# Patient Record
Sex: Male | Born: 1970 | Race: Black or African American | Hispanic: No | Smoking: Current some day smoker
Health system: Southern US, Community
[De-identification: ages and names within clinical notes are randomized; demographics above are authoritative.]

## PROBLEM LIST (undated history)

## (undated) DIAGNOSIS — I10 Essential (primary) hypertension: Secondary | ICD-10-CM

## (undated) DIAGNOSIS — E785 Hyperlipidemia, unspecified: Secondary | ICD-10-CM

## (undated) DIAGNOSIS — S82899A Other fracture of unspecified lower leg, initial encounter for closed fracture: Secondary | ICD-10-CM

## (undated) DIAGNOSIS — E119 Type 2 diabetes mellitus without complications: Secondary | ICD-10-CM

## (undated) DIAGNOSIS — N189 Chronic kidney disease, unspecified: Secondary | ICD-10-CM

## (undated) HISTORY — DX: Essential (primary) hypertension: I10

## (undated) HISTORY — DX: Hyperlipidemia, unspecified: E78.5

## (undated) HISTORY — PX: NEPHRECTOMY: SHX65

## (undated) HISTORY — DX: Chronic kidney disease, unspecified: N18.9

## (undated) HISTORY — DX: Type 2 diabetes mellitus without complications: E11.9

---

## 2001-10-07 ENCOUNTER — Encounter: Payer: Self-pay | Admitting: Family Medicine

## 2001-10-07 ENCOUNTER — Ambulatory Visit (HOSPITAL_COMMUNITY): Admission: RE | Admit: 2001-10-07 | Discharge: 2001-10-07 | Payer: Self-pay | Admitting: Family Medicine

## 2015-07-06 LAB — HEMOGLOBIN A1C: Hgb A1c MFr Bld: 9.3 % — AB (ref 4.0–6.0)

## 2015-08-14 ENCOUNTER — Ambulatory Visit (INDEPENDENT_AMBULATORY_CARE_PROVIDER_SITE_OTHER): Payer: BLUE CROSS/BLUE SHIELD | Admitting: "Endocrinology

## 2015-08-14 ENCOUNTER — Encounter: Payer: Self-pay | Admitting: "Endocrinology

## 2015-08-14 VITALS — BP 132/92 | HR 82 | Ht 71.0 in | Wt 223.0 lb

## 2015-08-14 DIAGNOSIS — E785 Hyperlipidemia, unspecified: Secondary | ICD-10-CM | POA: Diagnosis not present

## 2015-08-14 DIAGNOSIS — E1122 Type 2 diabetes mellitus with diabetic chronic kidney disease: Secondary | ICD-10-CM | POA: Insufficient documentation

## 2015-08-14 DIAGNOSIS — I10 Essential (primary) hypertension: Secondary | ICD-10-CM | POA: Diagnosis not present

## 2015-08-14 MED ORDER — SITAGLIPTIN PHOSPHATE 25 MG PO TABS: 25.0000 mg | ORAL_TABLET | Freq: Every day | ORAL | Status: DC

## 2015-08-14 NOTE — Patient Instructions (Signed)

## 2015-08-14 NOTE — Progress Notes (Signed)
Subjective:    Patient ID: Bryan Glenn, male    DOB: 05/29/1971. Patient is being seen in consultation for management of diabetes requested by  No primary care provider on file.  History reviewed. No pertinent past medical history. Past Surgical History  Procedure Laterality Date  . Nephrectomy     Social History   Social History  . Marital Status: Married    Spouse Name: N/A  . Number of Children: N/A  . Years of Education: N/A   Social History Main Topics  . Smoking status: Current Some Day Smoker  . Smokeless tobacco: None  . Alcohol Use: 0.0 oz/week    0 Standard drinks or equivalent per week  . Drug Use: No  . Sexual Activity: Not Asked   Other Topics Concern  . None   Social History Narrative  . None   Outpatient Encounter Prescriptions as of 08/14/2015  Medication Sig  . amLODipine (NORVASC) 10 MG tablet Take 10 mg by mouth daily.  Marland Kitchen losartan (COZAAR) 50 MG tablet Take 50 mg by mouth daily.  . multivitamin (RENA-VIT) TABS tablet Take 1 tablet by mouth daily.  . pravastatin (PRAVACHOL) 40 MG tablet Take 40 mg by mouth daily.  . [DISCONTINUED] glyBURIDE (DIABETA) 5 MG tablet Take 5 mg by mouth daily with breakfast.  . [DISCONTINUED] sitaGLIPtin (JANUVIA) 100 MG tablet Take 100 mg by mouth daily.  . sitaGLIPtin (JANUVIA) 25 MG tablet Take 1 tablet (25 mg total) by mouth daily.   No facility-administered encounter medications on file as of 08/14/2015.   ALLERGIES: No Known Allergies VACCINATION STATUS:  There is no immunization history on file for this patient.  Diabetes He presents for his initial diabetic visit. He has type 2 diabetes mellitus. Onset time: He was diagnosed at approximate age of 45 years. His disease course has been worsening. There are no hypoglycemic associated symptoms. Pertinent negatives for hypoglycemia include no confusion, headaches, pallor or seizures. Associated symptoms include polydipsia and polyuria. Pertinent negatives  for diabetes include no chest pain, no fatigue, no polyphagia and no weakness. There are no hypoglycemic complications. Symptoms are worsening. Diabetic complications include nephropathy. Risk factors for coronary artery disease include dyslipidemia, diabetes mellitus, hypertension, sedentary lifestyle and tobacco exposure. Current diabetic treatment includes oral agent (dual therapy). He is compliant with treatment most of the time. His weight is increasing steadily. He is following a generally unhealthy diet. He has not had a previous visit with a dietitian. He rarely participates in exercise. Home blood sugar record trend: He did not bring any meter nor log to review, and admits he does not monitor regularly. An ACE inhibitor/angiotensin II receptor blocker is being taken.  Hyperlipidemia This is a chronic problem. The current episode started more than 1 year ago. Pertinent negatives include no chest pain, myalgias or shortness of breath. Current antihyperlipidemic treatment includes statins. Risk factors for coronary artery disease include a sedentary lifestyle, male sex, diabetes mellitus and hypertension.  Hypertension This is a chronic problem. The current episode started more than 1 year ago. The problem is uncontrolled. Pertinent negatives include no chest pain, headaches, neck pain, palpitations or shortness of breath. Past treatments include ACE inhibitors. Hypertensive end-organ damage includes kidney disease.       Review of Systems  Constitutional: Negative for fatigue and unexpected weight change.  HENT: Negative for dental problem, mouth sores and trouble swallowing.   Eyes: Negative for visual disturbance.  Respiratory: Negative for cough, choking, chest tightness, shortness of  breath and wheezing.   Cardiovascular: Negative for chest pain, palpitations and leg swelling.  Gastrointestinal: Negative for nausea, vomiting, abdominal pain, diarrhea, constipation and abdominal distention.   Endocrine: Positive for polydipsia and polyuria. Negative for polyphagia.  Genitourinary: Negative for dysuria, urgency, hematuria and flank pain.  Musculoskeletal: Negative for myalgias, back pain, gait problem and neck pain.  Skin: Negative for pallor, rash and wound.  Neurological: Negative for seizures, syncope, weakness, numbness and headaches.  Psychiatric/Behavioral: Negative.  Negative for confusion and dysphoric mood.    Objective:    BP 132/92 mmHg  Pulse 82  Ht 5' 11"  (1.803 m)  Wt 223 lb (101.152 kg)  BMI 31.12 kg/m2  SpO2 99%  Wt Readings from Last 3 Encounters:  08/14/15 223 lb (101.152 kg)    Physical Exam  Constitutional: He is oriented to person, place, and time. He appears well-developed and well-nourished. He is cooperative. No distress.  HENT:  Head: Normocephalic and atraumatic.  Eyes: EOM are normal.  Neck: Normal range of motion. Neck supple. No tracheal deviation present. No thyromegaly present.  Cardiovascular: Normal rate, S1 normal, S2 normal and normal heart sounds.  Exam reveals no gallop.   No murmur heard. Pulses:      Dorsalis pedis pulses are 1+ on the right side, and 1+ on the left side.       Posterior tibial pulses are 1+ on the right side, and 1+ on the left side.  Pulmonary/Chest: Breath sounds normal. No respiratory distress. He has no wheezes.  Abdominal: Soft. Bowel sounds are normal. He exhibits no distension. There is no tenderness. There is no guarding and no CVA tenderness.  Musculoskeletal: He exhibits no edema.       Right shoulder: He exhibits no swelling and no deformity.  Neurological: He is alert and oriented to person, place, and time. He has normal strength and normal reflexes. No cranial nerve deficit or sensory deficit. Gait normal.  Skin: Skin is warm and dry. No rash noted. No cyanosis. Nails show no clubbing.  Psychiatric: He has a normal mood and affect. His speech is normal and behavior is normal. Judgment and thought  content normal. Cognition and memory are normal.    On 07/06/2015 his TSH was 9.3%  Assessment & Plan:   1. 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   - Patient has currently uncontrolled symptomatic type 2 DM since  44 years of age,  with most recent A1c of 9.3 %.   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 These are all discussed in detail with the patient.  - I have counseled the patient on diet management and weight loss, by adopting a carbohydrate restricted/protein rich diet.  - Suggestion is made for patient to avoid simple carbohydrates   from their diet including Cakes , Desserts, Ice Cream,  Soda (  diet and regular) , Sweet Tea , Candies,  Chips, Cookies, Artificial Sweeteners,   and "Sugar-free" Products . This will help patient to have stable blood glucose profile and potentially avoid unintended weight gain.  - I encouraged the patient to switch to  unprocessed or minimally  processed complex starch and increased protein intake (animal or plant source), fruits, and vegetables.  - Patient is advised to stick to a routine mealtimes to eat 3 meals  a day and avoid unnecessary snacks ( to snack only to correct hypoglycemia).  - The patient will be scheduled with Jearld Fenton, RDN, CDE for individualized DM education.  - I have approached patient with the following individualized plan to manage diabetes and patient agrees:   - He will likely need insulin therapy to control diabetes. -I approached him to start  strict monitoring of glucose  AC and HS and return in 10 days.  -Patient is encouraged to call clinic for  blood glucose levels less than 70 or above 300 mg /dl. - I will lower his dose of Januvia to 25 mg by mouth daily, discontinue glyburide, risk outweighs benefit for this patient.  -Patient is not a candidate for metformin andSGLT2 inhibitors due to CKD. -His best option is insulin treatment based on his commitment to monitor and blood glucose readings at next visit.  - Patient specific target  A1c;  LDL, HDL, Triglycerides, and  Waist Circumference were discussed in detail.  2) BP/HTN: Uncontrolled. Continue current medications including ACEI/ARB. 3) Lipids/HPL:   continue statins. 4)  Weight/Diet: CDE Consult will be initiated , exercise, and detailed carbohydrates information provided.  5) Chronic Care/Health Maintenance:  -Patient is on ACEI/ARB and Statin medications and encouraged to continue to follow up with Ophthalmology, Podiatrist at least yearly or according to recommendations, and advised to   stay away from smoking. I have recommended yearly flu vaccine and pneumonia vaccination at least every 5 years; moderate intensity exercise for up to 150 minutes weekly; and  sleep for at least 7 hours a day.  - 60 minutes of time was spent on the care of this patient , 50% of which was applied for counseling on diabetes complications and their preventions.  - Patient to bring meter and  blood glucose logs during their next visit.   - I advised patient to maintain close follow up with No primary care provider on file. for primary care needs.  Follow up plan: - Return in about 10 days (around 08/24/2015) for diabetes, high blood pressure, high cholesterol, follow up with meter and logs- no labs.  Glade Lloyd, MD Phone: [phone]  Fax: [phone]   08/14/2015, 1:51 PM

## 2015-08-30 ENCOUNTER — Ambulatory Visit: Payer: BLUE CROSS/BLUE SHIELD | Admitting: "Endocrinology

## 2015-09-01 ENCOUNTER — Ambulatory Visit: Payer: Self-pay | Admitting: Nutrition

## 2015-09-13 ENCOUNTER — Ambulatory Visit (INDEPENDENT_AMBULATORY_CARE_PROVIDER_SITE_OTHER): Payer: BLUE CROSS/BLUE SHIELD | Admitting: "Endocrinology

## 2015-09-13 ENCOUNTER — Encounter: Payer: Self-pay | Admitting: "Endocrinology

## 2015-09-13 VITALS — BP 117/82 | HR 75 | Ht 71.0 in | Wt 224.0 lb

## 2015-09-13 DIAGNOSIS — I10 Essential (primary) hypertension: Secondary | ICD-10-CM

## 2015-09-13 DIAGNOSIS — E1122 Type 2 diabetes mellitus with diabetic chronic kidney disease: Secondary | ICD-10-CM | POA: Diagnosis not present

## 2015-09-13 DIAGNOSIS — E785 Hyperlipidemia, unspecified: Secondary | ICD-10-CM | POA: Diagnosis not present

## 2015-09-13 MED ORDER — SITAGLIPTIN PHOSPHATE 25 MG PO TABS: 25.0000 mg | ORAL_TABLET | Freq: Every day | ORAL | Status: DC

## 2015-09-13 NOTE — Patient Instructions (Signed)

## 2015-09-13 NOTE — Progress Notes (Signed)
Subjective:    Patient ID: Bryan Glenn, male    DOB: 05-Oct-1970. Patient is being seen in consultation for management of diabetes requested by  No primary care provider on file.  History reviewed. No pertinent past medical history. Past Surgical History  Procedure Laterality Date  . Nephrectomy     Social History   Social History  . Marital Status: Married    Spouse Name: N/A  . Number of Children: N/A  . Years of Education: N/A   Social History Main Topics  . Smoking status: Current Some Day Smoker  . Smokeless tobacco: None  . Alcohol Use: 0.0 oz/week    0 Standard drinks or equivalent per week  . Drug Use: No  . Sexual Activity: Not Asked   Other Topics Concern  . None   Social History Narrative   Outpatient Encounter Prescriptions as of 09/13/2015  Medication Sig  . losartan (COZAAR) 50 MG tablet Take 50 mg by mouth daily.  . multivitamin (RENA-VIT) TABS tablet Take 1 tablet by mouth daily.  . pravastatin (PRAVACHOL) 40 MG tablet Take 40 mg by mouth daily.  . sitaGLIPtin (JANUVIA) 25 MG tablet Take 1 tablet (25 mg total) by mouth daily.  . [DISCONTINUED] sitaGLIPtin (JANUVIA) 25 MG tablet Take 1 tablet (25 mg total) by mouth daily.  Marland Kitchen amLODipine (NORVASC) 10 MG tablet Take 10 mg by mouth daily.   No facility-administered encounter medications on file as of 09/13/2015.   ALLERGIES: No Known Allergies VACCINATION STATUS:  There is no immunization history on file for this patient.  Diabetes He presents for his follow-up diabetic visit. He has type 2 diabetes mellitus. Onset time: He was diagnosed at approximate age of 45 years. His disease course has been worsening. There are no hypoglycemic associated symptoms. Pertinent negatives for hypoglycemia include no confusion, headaches, pallor or seizures. Associated symptoms include polydipsia and polyuria. Pertinent negatives for diabetes include no chest pain, no fatigue, no polyphagia and no weakness. There  are no hypoglycemic complications. Symptoms are worsening. Diabetic complications include nephropathy. Risk factors for coronary artery disease include dyslipidemia, diabetes mellitus, hypertension, sedentary lifestyle and tobacco exposure. Current diabetic treatment includes oral agent (dual therapy). He is compliant with treatment none of the time. His weight is stable. He is following a generally unhealthy diet. He has not had a previous visit with a dietitian. He rarely participates in exercise. Home blood sugar record trend: He was supposed to test blood glucose 4 times a day and bring his log along with his meter. Unfortunately he comes back without his meter and his logs show questionable readings of multiples of 10s. An ACE inhibitor/angiotensin II receptor blocker is being taken.  Hyperlipidemia This is a chronic problem. The current episode started more than 1 year ago. Pertinent negatives include no chest pain, myalgias or shortness of breath. Current antihyperlipidemic treatment includes statins. Risk factors for coronary artery disease include a sedentary lifestyle, male sex, diabetes mellitus and hypertension.  Hypertension This is a chronic problem. The current episode started more than 1 year ago. The problem is uncontrolled. Pertinent negatives include no chest pain, headaches, neck pain, palpitations or shortness of breath. Past treatments include ACE inhibitors. Hypertensive end-organ damage includes kidney disease.       Review of Systems  Constitutional: Negative for fatigue and unexpected weight change.  HENT: Negative for dental problem, mouth sores and trouble swallowing.   Eyes: Negative for visual disturbance.  Respiratory: Negative for cough, choking, chest tightness,  shortness of breath and wheezing.   Cardiovascular: Negative for chest pain, palpitations and leg swelling.  Gastrointestinal: Negative for nausea, vomiting, abdominal pain, diarrhea, constipation and  abdominal distention.  Endocrine: Positive for polydipsia and polyuria. Negative for polyphagia.  Genitourinary: Negative for dysuria, urgency, hematuria and flank pain.  Musculoskeletal: Negative for myalgias, back pain, gait problem and neck pain.  Skin: Negative for pallor, rash and wound.  Neurological: Negative for seizures, syncope, weakness, numbness and headaches.  Psychiatric/Behavioral: Negative.  Negative for confusion and dysphoric mood.    Objective:    BP 117/82 mmHg  Pulse 75  Ht 5' 11"  (1.803 m)  Wt 224 lb (101.606 kg)  BMI 31.26 kg/m2  SpO2 98%  Wt Readings from Last 3 Encounters:  09/13/15 224 lb (101.606 kg)  08/14/15 223 lb (101.152 kg)    Physical Exam  Constitutional: He is oriented to person, place, and time. He appears well-developed and well-nourished. He is cooperative. No distress.  HENT:  Head: Normocephalic and atraumatic.  Eyes: EOM are normal.  Neck: Normal range of motion. Neck supple. No tracheal deviation present. No thyromegaly present.  Cardiovascular: Normal rate, S1 normal, S2 normal and normal heart sounds.  Exam reveals no gallop.   No murmur heard. Pulses:      Dorsalis pedis pulses are 1+ on the right side, and 1+ on the left side.       Posterior tibial pulses are 1+ on the right side, and 1+ on the left side.  Pulmonary/Chest: Breath sounds normal. No respiratory distress. He has no wheezes.  Abdominal: Soft. Bowel sounds are normal. He exhibits no distension. There is no tenderness. There is no guarding and no CVA tenderness.  Musculoskeletal: He exhibits no edema.       Right shoulder: He exhibits no swelling and no deformity.  Neurological: He is alert and oriented to person, place, and time. He has normal strength and normal reflexes. No cranial nerve deficit or sensory deficit. Gait normal.  Skin: Skin is warm and dry. No rash noted. No cyanosis. Nails show no clubbing.  Psychiatric: He has a normal mood and affect. His speech is  normal and behavior is normal. Judgment and thought content normal. Cognition and memory are normal.    On 07/06/2015 his TSH was 9.3%  Assessment & Plan:   1. 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  -He did not commit to monitor blood glucose properly. - Patient has currently uncontrolled symptomatic type 2 DM since  45 years of age,  with most recent A1c of 9.3 %.   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 These are all discussed in detail with the patient.  - I have counseled the patient on diet management and weight loss, by adopting a carbohydrate restricted/protein rich diet.  - Suggestion is made for patient to avoid simple carbohydrates   from their diet including Cakes , Desserts, Ice Cream,  Soda (  diet and regular) , Sweet Tea , Candies,  Chips, Cookies, Artificial Sweeteners,   and "Sugar-free" Products . This will help patient to have stable blood glucose profile and potentially avoid  unintended weight gain.  - I encouraged the patient to switch to  unprocessed or minimally processed complex starch and increased protein intake (animal or plant source), fruits, and vegetables.  - Patient is advised to stick to a routine mealtimes to eat 3 meals  a day and avoid unnecessary snacks ( to snack only to correct hypoglycemia).  - The patient will be scheduled with Jearld Fenton, RDN, CDE for individualized DM education.  - I have approached patient with the following individualized plan to manage diabetes and patient agrees:   - He will likely need insulin therapy to control diabetes, unfortunately he came back with  suspicious looking NovoLog without his meter. -I approached him to start  strict monitoring of glucose  AC and HS and return in 1 week.  -Patient is encouraged to call clinic for blood glucose levels less than 70 or above 300 mg /dl. - I will lower his dose of Januvia to 25 mg by mouth daily, discontinue glyburide, risk outweighs benefit for this patient.  -Patient is not a candidate for metformin andSGLT2 inhibitors due to CKD. -His best option is insulin treatment based on his commitment to monitor and blood glucose readings at next visit.  - Patient specific target  A1c;  LDL, HDL, Triglycerides, and  Waist Circumference were discussed in detail.  2) BP/HTN: Uncontrolled. Continue current medications including ACEI/ARB. 3) Lipids/HPL:   continue statins. 4)  Weight/Diet: CDE Consult will be initiated , exercise, and detailed carbohydrates information provided.  5) Chronic Care/Health Maintenance:  -Patient is on ACEI/ARB and Statin medications and encouraged to continue to follow up with Ophthalmology, Podiatrist at least yearly or according to recommendations, and advised to   stay away from smoking. I have recommended yearly flu vaccine and pneumonia vaccination at least every 5 years; moderate intensity exercise for up to 150 minutes weekly; and  sleep for at least 7 hours a day.  - 25 minutes of time was spent on the care of this patient , 50% of which was applied for counseling on diabetes complications and their preventions.  - Patient to bring meter and  blood glucose logs during their next visit.   - I advised patient to maintain close follow up with No primary care provider on file. for primary care needs.  Follow up plan: - Return in about 1 week (around 09/20/2015) for diabetes, high blood pressure, high cholesterol, follow up with meter and logs- no labs.  Glade Lloyd, MD Phone: [phone]  Fax: [phone]   09/13/2015, 2:52 PM

## 2015-09-26 ENCOUNTER — Encounter: Payer: Self-pay | Admitting: "Endocrinology

## 2015-09-26 ENCOUNTER — Ambulatory Visit (INDEPENDENT_AMBULATORY_CARE_PROVIDER_SITE_OTHER): Payer: BLUE CROSS/BLUE SHIELD | Admitting: "Endocrinology

## 2015-09-26 VITALS — BP 137/85 | HR 78 | Ht 71.0 in | Wt 220.0 lb

## 2015-09-26 DIAGNOSIS — I10 Essential (primary) hypertension: Secondary | ICD-10-CM | POA: Diagnosis not present

## 2015-09-26 DIAGNOSIS — E1122 Type 2 diabetes mellitus with diabetic chronic kidney disease: Secondary | ICD-10-CM

## 2015-09-26 DIAGNOSIS — E785 Hyperlipidemia, unspecified: Secondary | ICD-10-CM

## 2015-09-26 MED ORDER — INSULIN ASPART PROT & ASPART (70-30 MIX) 100 UNIT/ML PEN: 20.0000 [IU] | PEN_INJECTOR | Freq: Two times a day (BID) | SUBCUTANEOUS | Status: DC

## 2015-09-26 NOTE — Patient Instructions (Signed)

## 2015-09-26 NOTE — Progress Notes (Signed)
Subjective:    Patient ID: Bryan Glenn, male    DOB: Jan 09, 1971. Patient is being seen in consultation for management of diabetes requested by  No primary care provider on file.  History reviewed. No pertinent past medical history. Past Surgical History  Procedure Laterality Date  . Nephrectomy     Social History   Social History  . Marital Status: Married    Spouse Name: N/A  . Number of Children: N/A  . Years of Education: N/A   Social History Main Topics  . Smoking status: Current Some Day Smoker  . Smokeless tobacco: None  . Alcohol Use: 0.0 oz/week    0 Standard drinks or equivalent per week  . Drug Use: No  . Sexual Activity: Not Asked   Other Topics Concern  . None   Social History Narrative   Outpatient Encounter Prescriptions as of 09/26/2015  Medication Sig  . amLODipine (NORVASC) 10 MG tablet Take 10 mg by mouth daily.  Marland Kitchen losartan (COZAAR) 50 MG tablet Take 50 mg by mouth daily.  . multivitamin (RENA-VIT) TABS tablet Take 1 tablet by mouth daily.  . pravastatin (PRAVACHOL) 40 MG tablet Take 40 mg by mouth daily.  . sitaGLIPtin (JANUVIA) 25 MG tablet Take 1 tablet (25 mg total) by mouth daily.  . insulin aspart protamine - aspart (NOVOLOG 70/30 MIX) (70-30) 100 UNIT/ML FlexPen Inject 0.2 mLs (20 Units total) into the skin 2 (two) times daily.   No facility-administered encounter medications on file as of 09/26/2015.   ALLERGIES: No Known Allergies VACCINATION STATUS:  There is no immunization history on file for this patient.  Diabetes He presents for his follow-up diabetic visit. He has type 2 diabetes mellitus. Onset time: He was diagnosed at approximate age of 67 years. His disease course has been worsening. There are no hypoglycemic associated symptoms. Pertinent negatives for hypoglycemia include no confusion, headaches, pallor or seizures. Associated symptoms include polydipsia and polyuria. Pertinent negatives for diabetes include no chest  pain, no fatigue, no polyphagia and no weakness. There are no hypoglycemic complications. Symptoms are worsening. Diabetic complications include nephropathy. Risk factors for coronary artery disease include dyslipidemia, diabetes mellitus, hypertension, sedentary lifestyle and tobacco exposure. Current diabetic treatment includes oral agent (dual therapy). He is compliant with treatment none of the time. His weight is stable. He is following a generally unhealthy diet. He has not had a previous visit with a dietitian. He rarely participates in exercise. Home blood sugar record trend: He brought in meter showing average blood glucose of 277 in the last 14 days monitoring 37 times. His overall blood glucose range is >200 mg/dl. An ACE inhibitor/angiotensin II receptor blocker is being taken.  Hyperlipidemia This is a chronic problem. The current episode started more than 1 year ago. Pertinent negatives include no chest pain, myalgias or shortness of breath. Current antihyperlipidemic treatment includes statins. Risk factors for coronary artery disease include a sedentary lifestyle, male sex, diabetes mellitus and hypertension.  Hypertension This is a chronic problem. The current episode started more than 1 year ago. The problem is uncontrolled. Pertinent negatives include no chest pain, headaches, neck pain, palpitations or shortness of breath. Past treatments include ACE inhibitors. Hypertensive end-organ damage includes kidney disease.       Review of Systems  Constitutional: Negative for fatigue and unexpected weight change.  HENT: Negative for dental problem, mouth sores and trouble swallowing.   Eyes: Negative for visual disturbance.  Respiratory: Negative for cough, choking, chest tightness,  shortness of breath and wheezing.   Cardiovascular: Negative for chest pain, palpitations and leg swelling.  Gastrointestinal: Negative for nausea, vomiting, abdominal pain, diarrhea, constipation and  abdominal distention.  Endocrine: Positive for polydipsia and polyuria. Negative for polyphagia.  Genitourinary: Negative for dysuria, urgency, hematuria and flank pain.  Musculoskeletal: Negative for myalgias, back pain, gait problem and neck pain.  Skin: Negative for pallor, rash and wound.  Neurological: Negative for seizures, syncope, weakness, numbness and headaches.  Psychiatric/Behavioral: Negative.  Negative for confusion and dysphoric mood.    Objective:    BP 137/85 mmHg  Pulse 78  Ht 5' 11"  (1.803 m)  Wt 220 lb (99.791 kg)  BMI 30.70 kg/m2  SpO2 97%  Wt Readings from Last 3 Encounters:  09/26/15 220 lb (99.791 kg)  09/13/15 224 lb (101.606 kg)  08/14/15 223 lb (101.152 kg)    Physical Exam  Constitutional: He is oriented to person, place, and time. He appears well-developed and well-nourished. He is cooperative. No distress.  HENT:  Head: Normocephalic and atraumatic.  Eyes: EOM are normal.  Neck: Normal range of motion. Neck supple. No tracheal deviation present. No thyromegaly present.  Cardiovascular: Normal rate, S1 normal, S2 normal and normal heart sounds.  Exam reveals no gallop.   No murmur heard. Pulses:      Dorsalis pedis pulses are 1+ on the right side, and 1+ on the left side.       Posterior tibial pulses are 1+ on the right side, and 1+ on the left side.  Pulmonary/Chest: Breath sounds normal. No respiratory distress. He has no wheezes.  Abdominal: Soft. Bowel sounds are normal. He exhibits no distension. There is no tenderness. There is no guarding and no CVA tenderness.  Musculoskeletal: He exhibits no edema.       Right shoulder: He exhibits no swelling and no deformity.  Neurological: He is alert and oriented to person, place, and time. He has normal strength and normal reflexes. No cranial nerve deficit or sensory deficit. Gait normal.  Skin: Skin is warm and dry. No rash noted. No cyanosis. Nails show no clubbing.  Psychiatric: He has a normal  mood and affect. His speech is normal and behavior is normal. Judgment and thought content normal. Cognition and memory are normal.    On 07/06/2015 his TSH was 9.3%  Assessment & Plan:   1. 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   - Patient has currently uncontrolled symptomatic type 2 DM since  45 years of age,  with most recent A1c of 9.3 %.   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 These are all discussed in detail with the patient.  - I have counseled the patient on diet management and weight loss, by adopting a carbohydrate restricted/protein rich diet.  - Suggestion is made for patient to avoid simple carbohydrates   from their diet including Cakes , Desserts, Ice Cream,  Soda (  diet and regular) , Sweet Tea , Candies,  Chips, Cookies, Artificial Sweeteners,   and "Sugar-free" Products . This will help patient to have stable blood glucose profile and potentially avoid unintended weight  gain.  - I encouraged the patient to switch to  unprocessed or minimally processed complex starch and increased protein intake (animal or plant source), fruits, and vegetables.  - Patient is advised to stick to a routine mealtimes to eat 3 meals  a day and avoid unnecessary snacks ( to snack only to correct hypoglycemia).  - The patient will be scheduled with Jearld Fenton, RDN, CDE for individualized DM education.  - I have approached patient with the following individualized plan to manage diabetes and patient agrees:   - He will need insulin therapy to control diabetes. - given his hesitance, I will not start basal /bolus  insulin. I will initiate Premixed insulin Novolog70/30 20 units with breakfast and supper associated with monitoring of BG premeal x 3 daily.    -Patient is encouraged to call clinic for blood glucose levels less than 70 or above 300 mg /dl. - I will lower his dose of Januvia to 25 mg by mouth daily, discontinue glyburide, risk outweighs benefit for this patient.  -Patient is not a candidate for metformin andSGLT2 inhibitors due to CKD.  - Patient specific target  A1c;  LDL, HDL, Triglycerides, and  Waist Circumference were discussed in detail.  2) BP/HTN: Uncontrolled. Continue current medications including ACEI/ARB. 3) Lipids/HPL:   continue statins. 4)  Weight/Diet: CDE Consult will be initiated , exercise, and detailed carbohydrates information provided.  5) Chronic Care/Health Maintenance:  -Patient is on ACEI/ARB and Statin medications and encouraged to continue to follow up with Ophthalmology, Podiatrist at least yearly or according to recommendations, and advised to   stay away from smoking. I have recommended yearly flu vaccine and pneumonia vaccination at least every 5 years; moderate intensity exercise for up to 150 minutes weekly; and  sleep for at least 7 hours a day.  - 25 minutes of time was spent on the care of this patient , 50% of which was applied for counseling on diabetes complications and their preventions.  - Patient to bring meter and  blood glucose logs during their next visit.   - I advised patient to maintain close follow up with No primary care provider on file. for primary care needs.  Follow up plan: - Return in about 2 weeks (around 10/10/2015) for diabetes, high blood pressure, high cholesterol, follow up with meter and logs- no labs.  Glade Lloyd, MD Phone: [phone]  Fax: [phone]   09/26/2015, 11:48 AM

## 2015-09-27 ENCOUNTER — Other Ambulatory Visit: Payer: Self-pay

## 2015-09-27 MED ORDER — "PEN NEEDLES 5/16"" 30G X 8 MM MISC": 1.0000 | Freq: Two times a day (BID) | Status: AC

## 2015-10-05 ENCOUNTER — Encounter: Payer: BLUE CROSS/BLUE SHIELD | Attending: Physician Assistant | Admitting: Nutrition

## 2015-10-05 ENCOUNTER — Encounter: Payer: Self-pay | Admitting: Nutrition

## 2015-10-05 VITALS — Ht 71.0 in | Wt 229.0 lb

## 2015-10-05 DIAGNOSIS — E118 Type 2 diabetes mellitus with unspecified complications: Secondary | ICD-10-CM

## 2015-10-05 DIAGNOSIS — N189 Chronic kidney disease, unspecified: Secondary | ICD-10-CM | POA: Diagnosis not present

## 2015-10-05 DIAGNOSIS — E1165 Type 2 diabetes mellitus with hyperglycemia: Secondary | ICD-10-CM

## 2015-10-05 DIAGNOSIS — E669 Obesity, unspecified: Secondary | ICD-10-CM

## 2015-10-05 DIAGNOSIS — E1122 Type 2 diabetes mellitus with diabetic chronic kidney disease: Secondary | ICD-10-CM | POA: Diagnosis present

## 2015-10-05 NOTE — Progress Notes (Signed)
  Medical Nutrition Therapy:  Appt start time: 0800 end time:  0900.  Assessment:  Primary concerns today:Diabetes. LIves with  His wife. His wife does the shopping and cooking. Most foods are baked. Testing blood sugars 4 tiimes per day. Taking 70/30 insulin 20 units twice a day. Januvia 25 mg per day. BS are better now that he is on insulin. Walks some for exercise and gets a lot of exercise on his job. Will work on diet and carb counting to balance his blood sugars better.  Lab Results  Component Value Date   HGBA1C 9.3* 07/06/2015   Wt Readings from Last 3 Encounters:  10/05/15 229 lb (103.874 kg)  09/26/15 220 lb (99.791 kg)  09/13/15 224 lb (101.606 kg)   Ht Readings from Last 3 Encounters:  10/05/15 5' 11"  (1.803 m)  09/26/15 5' 11"  (1.803 m)  09/13/15 5' 11"  (1.803 m)   Body mass index is 31.95 kg/(m^2).  Preferred Learning Style:   No preference indicated   Learning Readiness:   Ready  Change in progress   MEDICATIONS: see list   DIETARY INTAKE:  24-hr recall:  Eats 2-3 meals per day. Trying to work on eating more balanced meals. Has cut out sodas, sweets and junk food.  Usual physical activity: walks  Estimated energy needs: 1800  calories 200 g carbohydrates 150 g protein 56 g fat  Progress Towards Goal(s):  In progress.   Nutritional Diagnosis:  NB-1.1 Food and nutrition-related knowledge deficit As related to DM.  As evidenced by A1C 9.3%.    Intervention:  Nutrition and Diabetes education provided on My Plate, CHO counting, meal planning, portion sizes, timing of meals, avoiding snacks between meals unless having a low blood sugar, target ranges for A1C and blood sugars, signs/symptoms and treatment of hyper/hypoglycemia, monitoring blood sugars, taking medications as prescribed, benefits of exercising 30 minutes per day and prevention of complications of DM.  Goals; 1. Follow Plate Method 2. Eat 3-4 carb choices per meal. 3. Increase fresh  fruits and vegetables. 4. Take insulin as prescribed. 5. Do not skip meals. 6. No snacks between meals. 7. Get A1C to 7% in three months 8. Drink only water with meals. 9. Cut out cake,sweets, soda, tea, juices or junk food.  Teaching Method Utilized: Visual Auditory Hands on  Handouts given during visit include:  The Plate Method  Meal Plan Card  Diabetes Instructions  Barriers to learning/adherence to lifestyle change: None  Demonstrated degree of understanding via:  Teach Back   Monitoring/Evaluation:  Dietary intake, exercise, meal planning, SBG, and body weight in 1 month(s).

## 2015-10-05 NOTE — Patient Instructions (Signed)
Goals; 1. Follow Plate Method 2. Eat 3-4 carb choices per meal. 3. Increase fresh fruits and vegetables. 4. Take insulin as prescribed. 5. Do not skip meals. 6. No snacks between meals. 7. Get A1C to 7% in three months 8. Drink only water with meals. 9. Cut out cake,sweets, soda, tea, juices or junk food.

## 2015-10-09 ENCOUNTER — Other Ambulatory Visit: Payer: Self-pay

## 2015-10-09 MED ORDER — BLOOD GLUCOSE MONITOR KIT: PACK | Status: AC

## 2015-10-09 MED ORDER — GLUCOSE BLOOD VI STRP: ORAL_STRIP | Status: AC

## 2015-10-09 MED ORDER — ACCU-CHEK SOFT TOUCH LANCETS MISC: Status: AC

## 2015-10-09 MED ORDER — ACCU-CHEK AVIVA PLUS W/DEVICE KIT: PACK | Status: AC

## 2015-10-10 ENCOUNTER — Ambulatory Visit: Payer: BLUE CROSS/BLUE SHIELD | Admitting: "Endocrinology

## 2015-10-11 ENCOUNTER — Ambulatory Visit (INDEPENDENT_AMBULATORY_CARE_PROVIDER_SITE_OTHER): Payer: BLUE CROSS/BLUE SHIELD | Admitting: "Endocrinology

## 2015-10-11 ENCOUNTER — Encounter: Payer: Self-pay | Admitting: "Endocrinology

## 2015-10-11 VITALS — BP 125/86 | HR 78 | Ht 71.0 in | Wt 230.0 lb

## 2015-10-11 DIAGNOSIS — I10 Essential (primary) hypertension: Secondary | ICD-10-CM | POA: Diagnosis not present

## 2015-10-11 DIAGNOSIS — E785 Hyperlipidemia, unspecified: Secondary | ICD-10-CM

## 2015-10-11 DIAGNOSIS — E1122 Type 2 diabetes mellitus with diabetic chronic kidney disease: Secondary | ICD-10-CM

## 2015-10-11 NOTE — Patient Instructions (Signed)

## 2015-10-11 NOTE — Progress Notes (Signed)
Subjective:    Patient ID: Bryan Glenn, male    DOB: 1971-06-17. Patient is  Here for follow-up of his uncontrolled diabetes,  Hypertension, and hyperlipidemia.   Past Medical History  Diagnosis Date  . Diabetes mellitus without complication ([place])   . Hypertension   . Chronic kidney disease   . Hyperlipidemia    Past Surgical History  Procedure Laterality Date  . Nephrectomy     Social History   Social History  . Marital Status: Married    Spouse Name: N/A  . Number of Children: N/A  . Years of Education: N/A   Social History Main Topics  . Smoking status: Current Some Day Smoker  . Smokeless tobacco: Not on file  . Alcohol Use: 0.0 oz/week    0 Standard drinks or equivalent per week  . Drug Use: No  . Sexual Activity: Not on file   Other Topics Concern  . Not on file   Social History Narrative   Outpatient Encounter Prescriptions as of 10/11/2015  Medication Sig  . amLODipine (NORVASC) 10 MG tablet Take 10 mg by mouth daily.  . blood glucose meter kit and supplies KIT Dispense based on patient and insurance preference. Use up to four times daily as directed. (FOR ICD-10 E11.65)  . Blood Glucose Monitoring Suppl (ACCU-CHEK AVIVA PLUS) w/Device KIT Use 4 x daily as directed. E11.65  . glucose blood (ACCU-CHEK AVIVA) test strip Use as instructed 4 x daily. E11.65  . insulin aspart protamine - aspart (NOVOLOG 70/30 MIX) (70-30) 100 UNIT/ML FlexPen Inject 0.2 mLs (20 Units total) into the skin 2 (two) times daily.  . Insulin Pen Needle (PEN NEEDLES 5/16") 30G X 8 MM MISC 1 each by Does not apply route 2 (two) times daily.  . Lancets (ACCU-CHEK SOFT TOUCH) lancets Use as instructed 4 x daily  . losartan (COZAAR) 50 MG tablet Take 50 mg by mouth daily.  . multivitamin (RENA-VIT) TABS tablet Take 1 tablet by mouth daily.  . pravastatin (PRAVACHOL) 40 MG tablet Take 40 mg by mouth daily.  . sitaGLIPtin (JANUVIA) 25 MG tablet Take 1 tablet (25 mg total) by mouth  daily.   No facility-administered encounter medications on file as of 10/11/2015.   ALLERGIES: No Known Allergies VACCINATION STATUS:  There is no immunization history on file for this patient.  Diabetes He presents for his follow-up diabetic visit. He has type 2 diabetes mellitus. Onset time: He was diagnosed at approximate age of 13 years. His disease course has been improving. There are no hypoglycemic associated symptoms. Pertinent negatives for hypoglycemia include no confusion, headaches, pallor or seizures. Pertinent negatives for diabetes include no chest pain, no fatigue, no polydipsia, no polyphagia, no polyuria and no weakness. There are no hypoglycemic complications. Symptoms are improving. Diabetic complications include nephropathy. Risk factors for coronary artery disease include dyslipidemia, diabetes mellitus, hypertension, sedentary lifestyle and tobacco exposure. Current diabetic treatment includes oral agent (dual therapy). He is compliant with treatment none of the time. His weight is stable. He is following a generally unhealthy diet. He has had a previous visit with a dietitian. He rarely participates in exercise. Home blood sugar record trend: He brought in log showing near target blood glucose profile, after initiation of the mixed insulin twice a day. His breakfast blood glucose range is generally 140-180 mg/dl. His lunch blood glucose range is generally 140-180 mg/dl. His dinner blood glucose range is generally 140-180 mg/dl. His overall blood glucose range is 140-180 mg/dl.  An ACE inhibitor/angiotensin II receptor blocker is being taken.  Hyperlipidemia This is a chronic problem. The current episode started more than 1 year ago. Pertinent negatives include no chest pain, myalgias or shortness of breath. Current antihyperlipidemic treatment includes statins. Risk factors for coronary artery disease include a sedentary lifestyle, male sex, diabetes mellitus and hypertension.   Hypertension This is a chronic problem. The current episode started more than 1 year ago. The problem is uncontrolled. Pertinent negatives include no chest pain, headaches, neck pain, palpitations or shortness of breath. Past treatments include ACE inhibitors. Hypertensive end-organ damage includes kidney disease.       Review of Systems  Constitutional: Negative for fatigue and unexpected weight change.  HENT: Negative for dental problem, mouth sores and trouble swallowing.   Eyes: Negative for visual disturbance.  Respiratory: Negative for cough, choking, chest tightness, shortness of breath and wheezing.   Cardiovascular: Negative for chest pain, palpitations and leg swelling.  Gastrointestinal: Negative for nausea, vomiting, abdominal pain, diarrhea, constipation and abdominal distention.  Endocrine: Negative for polydipsia, polyphagia and polyuria.  Genitourinary: Negative for dysuria, urgency, hematuria and flank pain.  Musculoskeletal: Negative for myalgias, back pain, gait problem and neck pain.  Skin: Negative for pallor, rash and wound.  Neurological: Negative for seizures, syncope, weakness, numbness and headaches.  Psychiatric/Behavioral: Negative.  Negative for confusion and dysphoric mood.    Objective:    BP 125/86 mmHg  Pulse 78  Ht 5' 11"  (1.803 m)  Wt 230 lb (104.327 kg)  BMI 32.09 kg/m2  SpO2 100%  Wt Readings from Last 3 Encounters:  10/11/15 230 lb (104.327 kg)  10/05/15 229 lb (103.874 kg)  09/26/15 220 lb (99.791 kg)    Physical Exam  Constitutional: He is oriented to person, place, and time. He appears well-developed and well-nourished. He is cooperative. No distress.  HENT:  Head: Normocephalic and atraumatic.  Eyes: EOM are normal.  Neck: Normal range of motion. Neck supple. No tracheal deviation present. No thyromegaly present.  Cardiovascular: Normal rate, S1 normal, S2 normal and normal heart sounds.  Exam reveals no gallop.   No murmur  heard. Pulses:      Dorsalis pedis pulses are 1+ on the right side, and 1+ on the left side.       Posterior tibial pulses are 1+ on the right side, and 1+ on the left side.  Pulmonary/Chest: Breath sounds normal. No respiratory distress. He has no wheezes.  Abdominal: Soft. Bowel sounds are normal. He exhibits no distension. There is no tenderness. There is no guarding and no CVA tenderness.  Musculoskeletal: He exhibits no edema.       Right shoulder: He exhibits no swelling and no deformity.  Neurological: He is alert and oriented to person, place, and time. He has normal strength and normal reflexes. No cranial nerve deficit or sensory deficit. Gait normal.  Skin: Skin is warm and dry. No rash noted. No cyanosis. Nails show no clubbing.  Psychiatric: He has a normal mood and affect. His speech is normal and behavior is normal. Judgment and thought content normal. Cognition and memory are normal.    On 07/06/2015 his a1c was 9.3%  Assessment & Plan:   1. 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   - Patient has currently uncontrolled symptomatic type 2 DM since  45 years of age,  with  most recent A1c of 9.3 %.  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 These are all discussed in detail with the patient.  - I have counseled the patient on diet management and weight loss, by adopting a carbohydrate restricted/protein rich diet.  - Suggestion is made for patient to avoid simple carbohydrates   from their diet including Cakes , Desserts, Ice Cream,  Soda (  diet and regular) , Sweet Tea , Candies,  Chips, Cookies, Artificial  Sweeteners,   and "Sugar-free" Products . This will help patient to have stable blood glucose profile and potentially avoid unintended weight gain.  - I encouraged the patient to switch to  unprocessed or minimally processed complex starch and increased protein intake (animal or plant source), fruits, and vegetables.  - Patient is advised to stick to a routine mealtimes to eat 3 meals  a day and avoid unnecessary snacks ( to snack only to correct hypoglycemia).  - The patient will be scheduled with Jearld Fenton, RDN, CDE for individualized DM education.  - I have approached patient with the following individualized plan to manage diabetes and patient agrees:   - He will  Continue to need insulin therapy to control diabetes. - given his hesitance, I will continue  Premixed insulin Novolog70/30 20 units with breakfast and supper associated with monitoring of BG premeal x 3 daily.  -Patient is encouraged to call clinic for blood glucose levels less than 70 or above 300 mg /dl. - I will lower his dose of Januvia to 25 mg by mouth daily. -Patient is not a candidate for metformin andSGLT2 inhibitors due to CKD.  - Patient specific target  A1c;  LDL, HDL, Triglycerides, and  Waist Circumference were discussed in detail.  2) BP/HTN: Uncontrolled. Continue current medications including ACEI/ARB. 3) Lipids/HPL:   continue statins. 4)  Weight/Diet: CDE Consult will be initiated , exercise, and detailed carbohydrates information provided.  5) Chronic Care/Health Maintenance:  -Patient is on ACEI/ARB and Statin medications and encouraged to continue to follow up with Ophthalmology, Podiatrist at least yearly or according to recommendations, and advised to   stay away from smoking. I have recommended yearly flu vaccine and pneumonia vaccination at least every 5 years; moderate intensity exercise for up to 150 minutes weekly; and  sleep for at least 7 hours a day.  - 25 minutes of time was spent on the  care of this patient , 50% of which was applied for counseling on diabetes complications and their preventions.  - Patient to bring meter and  blood glucose logs during their next visit.   - I advised patient to maintain close follow up with Adrian Prows, PA-C for primary care needs.  Follow up plan: - Return in about 6 weeks (around 11/22/2015) for diabetes, high blood pressure, high cholesterol, follow up with pre-visit labs, meter, and logs.  Glade Lloyd, MD Phone: [phone]  Fax: [phone]   10/11/2015, 11:28 AM

## 2015-11-07 ENCOUNTER — Other Ambulatory Visit: Payer: Self-pay

## 2015-11-07 MED ORDER — SITAGLIPTIN PHOSPHATE 25 MG PO TABS: 25.0000 mg | ORAL_TABLET | Freq: Every day | ORAL | Status: DC

## 2015-11-08 ENCOUNTER — Ambulatory Visit: Payer: BLUE CROSS/BLUE SHIELD | Admitting: Nutrition

## 2015-11-22 ENCOUNTER — Other Ambulatory Visit: Payer: Self-pay | Admitting: "Endocrinology

## 2015-11-22 LAB — LIPID PANEL
CHOL/HDL RATIO: 3.5 ratio (ref <=5.0)
Cholesterol: 156 mg/dL (ref 125–200)
HDL: 45 mg/dL (ref >=40)
LDL Cholesterol: 98 mg/dL (ref <130)
Triglycerides: 63 mg/dL (ref <150)
VLDL: 13 mg/dL (ref <30)

## 2015-11-22 LAB — BASIC METABOLIC PANEL
BUN: 13 mg/dL (ref 7–25)
CHLORIDE: 102 mmol/L (ref 98–110)
CO2: 28 mmol/L (ref 20–31)
Calcium: 9.8 mg/dL (ref 8.6–10.3)
Creat: 1.24 mg/dL (ref 0.60–1.35)
Glucose, Bld: 135 mg/dL — ABNORMAL HIGH (ref 65–99)
POTASSIUM: 4.3 mmol/L (ref 3.5–5.3)
Sodium: 140 mmol/L (ref 135–146)

## 2015-11-23 ENCOUNTER — Ambulatory Visit: Payer: BLUE CROSS/BLUE SHIELD | Admitting: Nutrition

## 2015-11-23 ENCOUNTER — Ambulatory Visit: Payer: BLUE CROSS/BLUE SHIELD | Admitting: "Endocrinology

## 2015-11-23 LAB — HEMOGLOBIN A1C
HEMOGLOBIN A1C: 9.4 % — AB (ref <5.7)
MEAN PLASMA GLUCOSE: 223 mg/dL — AB (ref <117)

## 2015-11-29 ENCOUNTER — Encounter: Payer: Self-pay | Admitting: "Endocrinology

## 2015-11-29 ENCOUNTER — Ambulatory Visit (INDEPENDENT_AMBULATORY_CARE_PROVIDER_SITE_OTHER): Payer: BLUE CROSS/BLUE SHIELD | Admitting: "Endocrinology

## 2015-11-29 VITALS — BP 131/80 | HR 80 | Ht 71.0 in | Wt 231.0 lb

## 2015-11-29 DIAGNOSIS — E785 Hyperlipidemia, unspecified: Secondary | ICD-10-CM | POA: Diagnosis not present

## 2015-11-29 DIAGNOSIS — E1122 Type 2 diabetes mellitus with diabetic chronic kidney disease: Secondary | ICD-10-CM | POA: Diagnosis not present

## 2015-11-29 DIAGNOSIS — I10 Essential (primary) hypertension: Secondary | ICD-10-CM

## 2015-11-29 NOTE — Patient Instructions (Signed)

## 2015-11-29 NOTE — Progress Notes (Signed)
Subjective:    Patient ID: Bryan Glenn, male    DOB: 15-Aug-1971. Patient is  Here for follow-up of his uncontrolled diabetes,  Hypertension, and hyperlipidemia.   Past Medical History  Diagnosis Date  . Diabetes mellitus without complication ([place])   . Hypertension   . Chronic kidney disease   . Hyperlipidemia    Past Surgical History  Procedure Laterality Date  . Nephrectomy     Social History   Social History  . Marital Status: Married    Spouse Name: N/A  . Number of Children: N/A  . Years of Education: N/A   Social History Main Topics  . Smoking status: Current Some Day Smoker  . Smokeless tobacco: None  . Alcohol Use: 0.0 oz/week    0 Standard drinks or equivalent per week  . Drug Use: No  . Sexual Activity: Not Asked   Other Topics Concern  . None   Social History Narrative   Outpatient Encounter Prescriptions as of 11/29/2015  Medication Sig  . Insulin Aspart Prot & Aspart (NOVOLOG MIX 70/30 FLEXPEN [place]) Inject 15-20 Units into the skin 2 (two) times daily before a meal. Take 20 units with breakfast and 15 units with supper when glucose is above 90  . amLODipine (NORVASC) 10 MG tablet Take 10 mg by mouth daily.  . blood glucose meter kit and supplies KIT Dispense based on patient and insurance preference. Use up to four times daily as directed. (FOR ICD-10 E11.65)  . Blood Glucose Monitoring Suppl (ACCU-CHEK AVIVA PLUS) w/Device KIT Use 4 x daily as directed. E11.65  . glucose blood (ACCU-CHEK AVIVA) test strip Use as instructed 4 x daily. E11.65  . Insulin Pen Needle (PEN NEEDLES 5/16") 30G X 8 MM MISC 1 each by Does not apply route 2 (two) times daily.  . Lancets (ACCU-CHEK SOFT TOUCH) lancets Use as instructed 4 x daily  . losartan (COZAAR) 50 MG tablet Take 50 mg by mouth daily.  . multivitamin (RENA-VIT) TABS tablet Take 1 tablet by mouth daily.  . pravastatin (PRAVACHOL) 40 MG tablet Take 40 mg by mouth daily.  . sitaGLIPtin (JANUVIA) 25 MG  tablet Take 1 tablet (25 mg total) by mouth daily.  . [DISCONTINUED] insulin aspart protamine - aspart (NOVOLOG 70/30 MIX) (70-30) 100 UNIT/ML FlexPen Inject 0.2 mLs (20 Units total) into the skin 2 (two) times daily.   No facility-administered encounter medications on file as of 11/29/2015.   ALLERGIES: No Known Allergies VACCINATION STATUS:  There is no immunization history on file for this patient.  Diabetes He presents for his follow-up diabetic visit. He has type 2 diabetes mellitus. Onset time: He was diagnosed at approximate age of 45 years. His disease course has been improving. There are no hypoglycemic associated symptoms. Pertinent negatives for hypoglycemia include no confusion, headaches, pallor or seizures. Pertinent negatives for diabetes include no chest pain, no fatigue, no polydipsia, no polyphagia, no polyuria and no weakness. There are no hypoglycemic complications. Symptoms are improving. Diabetic complications include nephropathy. Risk factors for coronary artery disease include dyslipidemia, diabetes mellitus, hypertension, sedentary lifestyle and tobacco exposure. Current diabetic treatment includes oral agent (dual therapy). He is compliant with treatment none of the time. His weight is stable. He is following a generally unhealthy diet. He has had a previous visit with a dietitian. He rarely participates in exercise. Home blood sugar record trend: He brought in log showing near target blood glucose profile, after initiation of the mixed insulin twice a  day. His breakfast blood glucose range is generally 110-130 mg/dl. His dinner blood glucose range is generally 110-130 mg/dl. An ACE inhibitor/angiotensin II receptor blocker is being taken.  Hyperlipidemia This is a chronic problem. The current episode started more than 1 year ago. Pertinent negatives include no chest pain, myalgias or shortness of breath. Current antihyperlipidemic treatment includes statins. Risk factors for  coronary artery disease include a sedentary lifestyle, male sex, diabetes mellitus and hypertension.  Hypertension This is a chronic problem. The current episode started more than 1 year ago. The problem is uncontrolled. Pertinent negatives include no chest pain, headaches, neck pain, palpitations or shortness of breath. Past treatments include ACE inhibitors. Hypertensive end-organ damage includes kidney disease.       Review of Systems  Constitutional: Negative for fatigue and unexpected weight change.  HENT: Negative for dental problem, mouth sores and trouble swallowing.   Eyes: Negative for visual disturbance.  Respiratory: Negative for cough, choking, chest tightness, shortness of breath and wheezing.   Cardiovascular: Negative for chest pain, palpitations and leg swelling.  Gastrointestinal: Negative for nausea, vomiting, abdominal pain, diarrhea, constipation and abdominal distention.  Endocrine: Negative for polydipsia, polyphagia and polyuria.  Genitourinary: Negative for dysuria, urgency, hematuria and flank pain.  Musculoskeletal: Negative for myalgias, back pain, gait problem and neck pain.  Skin: Negative for pallor, rash and wound.  Neurological: Negative for seizures, syncope, weakness, numbness and headaches.  Psychiatric/Behavioral: Negative.  Negative for confusion and dysphoric mood.    Objective:    BP 131/80 mmHg  Pulse 80  Ht 5' 11" (1.803 m)  Wt 231 lb (104.781 kg)  BMI 32.23 kg/m2  SpO2 100%  Wt Readings from Last 3 Encounters:  11/29/15 231 lb (104.781 kg)  10/11/15 230 lb (104.327 kg)  10/05/15 229 lb (103.874 kg)    Physical Exam  Constitutional: He is oriented to person, place, and time. He appears well-developed and well-nourished. He is cooperative. No distress.  HENT:  Head: Normocephalic and atraumatic.  Eyes: EOM are normal.  Neck: Normal range of motion. Neck supple. No tracheal deviation present. No thyromegaly present.  Cardiovascular:  Normal rate, S1 normal, S2 normal and normal heart sounds.  Exam reveals no gallop.   No murmur heard. Pulses:      Dorsalis pedis pulses are 1+ on the right side, and 1+ on the left side.       Posterior tibial pulses are 1+ on the right side, and 1+ on the left side.  Pulmonary/Chest: Breath sounds normal. No respiratory distress. He has no wheezes.  Abdominal: Soft. Bowel sounds are normal. He exhibits no distension. There is no tenderness. There is no guarding and no CVA tenderness.  Musculoskeletal: He exhibits no edema.       Right shoulder: He exhibits no swelling and no deformity.  Neurological: He is alert and oriented to person, place, and time. He has normal strength and normal reflexes. No cranial nerve deficit or sensory deficit. Gait normal.  Skin: Skin is warm and dry. No rash noted. No cyanosis. Nails show no clubbing.  Psychiatric: He has a normal mood and affect. His speech is normal and behavior is normal. Judgment and thought content normal. Cognition and memory are normal.    His recent a1c was 9.4%  Assessment & Plan:   1. 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   -  Patient has currently uncontrolled symptomatic type 2 DM since  45 years of age,  with most recent A1c of 9.4 %.  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 These are all discussed in detail with the patient.  - I have counseled the patient on diet management and weight loss, by adopting a carbohydrate restricted/protein rich diet.  - Suggestion is made for patient to avoid simple carbohydrates   from their diet including Cakes ,  Desserts, Ice Cream,  Soda (  diet and regular) , Sweet Tea , Candies,  Chips, Cookies, Artificial Sweeteners,   and "Sugar-free" Products . This will help patient to have stable blood glucose profile and potentially avoid unintended weight gain.  - I encouraged the patient to switch to  unprocessed or minimally processed complex starch and increased protein intake (animal or plant source), fruits, and vegetables.  - Patient is advised to stick to a routine mealtimes to eat 3 meals  a day and avoid unnecessary snacks ( to snack only to correct hypoglycemia).  - The patient will be scheduled with Jearld Fenton, RDN, CDE for individualized DM education.  - I have approached patient with the following individualized plan to manage diabetes and patient agrees:   - He will  Continue to need insulin therapy to control diabetes. - given his hesitance, I will continue  Premixed insulin Novolog70/30 20 units with breakfast and  15 units with supper associated with monitoring of BG premeal x 3 daily.  -Patient is encouraged to call clinic for blood glucose levels less than 70 or above 300 mg /dl. - I will continue Januvia to 25 mg by mouth daily. -Patient is not a candidate for metformin andSGLT2 inhibitors due to CKD.  - Patient specific target  A1c;  LDL, HDL, Triglycerides, and  Waist Circumference were discussed in detail.  2) BP/HTN: Uncontrolled. Continue current medications including ACEI/ARB. 3) Lipids/HPL:   continue statins. 4)  Weight/Diet: CDE Consult will be initiated , exercise, and detailed carbohydrates information provided.  5) Chronic Care/Health Maintenance:  -Patient is on ACEI/ARB and Statin medications and encouraged to continue to follow up with Ophthalmology, Podiatrist at least yearly or according to recommendations, and advised to   stay away from smoking. I have recommended yearly flu vaccine and pneumonia vaccination at least every 5 years; moderate intensity exercise for  up to 150 minutes weekly; and  sleep for at least 7 hours a day.  - 25 minutes of time was spent on the care of this patient , 50% of which was applied for counseling on diabetes complications and their preventions.  - Patient to bring meter and  blood glucose logs during their next visit.   - I advised patient to maintain close follow up with Adrian Prows, PA-C for primary care needs.  Follow up plan: - Return in about 3 months (around 02/29/2016) for diabetes, high blood pressure, high cholesterol, follow up with pre-visit labs, meter, and logs.  Glade Lloyd, MD Phone: [phone]  Fax: [phone]   11/29/2015, 4:17 PM

## 2015-11-30 ENCOUNTER — Ambulatory Visit: Payer: BLUE CROSS/BLUE SHIELD | Admitting: Nutrition

## 2015-12-04 ENCOUNTER — Other Ambulatory Visit: Payer: Self-pay

## 2015-12-04 MED ORDER — PRAVASTATIN SODIUM 40 MG PO TABS: 40.0000 mg | ORAL_TABLET | Freq: Every day | ORAL | Status: DC

## 2015-12-04 MED ORDER — AMLODIPINE BESYLATE 10 MG PO TABS: 10.0000 mg | ORAL_TABLET | Freq: Every day | ORAL | Status: DC

## 2015-12-04 MED ORDER — LOSARTAN POTASSIUM 50 MG PO TABS: 50.0000 mg | ORAL_TABLET | Freq: Every day | ORAL | Status: DC

## 2016-02-02 ENCOUNTER — Other Ambulatory Visit: Payer: Self-pay

## 2016-02-02 MED ORDER — INSULIN LISPRO PROT & LISPRO (75-25 MIX) 100 UNIT/ML KWIKPEN: PEN_INJECTOR | SUBCUTANEOUS | Status: DC

## 2016-02-19 ENCOUNTER — Other Ambulatory Visit: Payer: Self-pay

## 2016-02-19 MED ORDER — SITAGLIPTIN PHOSPHATE 25 MG PO TABS: 25.0000 mg | ORAL_TABLET | Freq: Every day | ORAL | Status: DC

## 2016-02-29 ENCOUNTER — Other Ambulatory Visit: Payer: Self-pay

## 2016-02-29 MED ORDER — PRAVASTATIN SODIUM 40 MG PO TABS: 40.0000 mg | ORAL_TABLET | Freq: Every day | ORAL | Status: DC

## 2016-03-06 ENCOUNTER — Ambulatory Visit: Payer: BLUE CROSS/BLUE SHIELD | Admitting: "Endocrinology

## 2016-03-07 ENCOUNTER — Telehealth: Payer: Self-pay

## 2016-03-07 MED ORDER — LOSARTAN POTASSIUM 50 MG PO TABS: 50.0000 mg | ORAL_TABLET | Freq: Every day | ORAL | Status: DC

## 2016-03-07 MED ORDER — AMLODIPINE BESYLATE 10 MG PO TABS: 10.0000 mg | ORAL_TABLET | Freq: Every day | ORAL | Status: DC

## 2016-03-07 NOTE — Telephone Encounter (Signed)
Refills sent

## 2016-09-03 ENCOUNTER — Other Ambulatory Visit: Payer: Self-pay

## 2016-09-03 MED ORDER — AMLODIPINE BESYLATE 10 MG PO TABS: 10.0000 mg | ORAL_TABLET | Freq: Every day | ORAL | 2 refills | Status: DC

## 2016-09-03 MED ORDER — PRAVASTATIN SODIUM 40 MG PO TABS: 40.0000 mg | ORAL_TABLET | Freq: Every day | ORAL | 2 refills | Status: DC

## 2018-02-16 ENCOUNTER — Encounter (INDEPENDENT_AMBULATORY_CARE_PROVIDER_SITE_OTHER): Payer: Self-pay | Admitting: Orthopedic Surgery

## 2018-02-16 ENCOUNTER — Ambulatory Visit (INDEPENDENT_AMBULATORY_CARE_PROVIDER_SITE_OTHER): Payer: BLUE CROSS/BLUE SHIELD | Admitting: Orthopedic Surgery

## 2018-02-16 ENCOUNTER — Other Ambulatory Visit (INDEPENDENT_AMBULATORY_CARE_PROVIDER_SITE_OTHER): Payer: Self-pay | Admitting: Orthopedic Surgery

## 2018-02-16 VITALS — Ht 71.0 in | Wt 231.0 lb

## 2018-02-16 DIAGNOSIS — E1142 Type 2 diabetes mellitus with diabetic polyneuropathy: Secondary | ICD-10-CM

## 2018-02-16 DIAGNOSIS — S82892A Other fracture of left lower leg, initial encounter for closed fracture: Secondary | ICD-10-CM

## 2018-02-16 DIAGNOSIS — S8265XA Nondisplaced fracture of lateral malleolus of left fibula, initial encounter for closed fracture: Secondary | ICD-10-CM

## 2018-02-16 NOTE — Progress Notes (Signed)
Office Visit Note   Patient: Bryan Glenn           Date of Birth: 07-25-1971           MRN: [id number] Visit Date: 02/16/2018              Requested by: Adrian Prows, [place] [address] PCP: Adrian Prows, PA-C  Chief Complaint  Patient presents with  . Left Ankle - Pain    S/p fall DOI 03/13/18      HPI: Patient is a 47 year old gentleman with type 2 diabetes uncontrolled hemoglobin A1c greater than 9 who slipped on wet floor sustaining a Weber the displaced fibular fracture 1 week ago.  Patient was placed in a fracture boot he is currently ambulating with one cane he has been taking ibuprofen.  Patient also states that he smokes.  Assessment & Plan: Visit Diagnoses:  1. Ankle fracture, left, closed, initial encounter   2. Diabetic polyneuropathy associated with type 2 diabetes mellitus ([place])     Plan: Due to the displacement of the ankle fracture recommend proceeding with open reduction internal fixation risk and benefits were discussed including infection neurovascular injury pain DVT need for additional surgery increase complications due to history of tobacco and uncontrolled diabetes.  Patient states that he understands and wishes to proceed at this time.  Follow-Up Instructions: Return in about 2 weeks (around 03/02/2018).   Ortho Exam  Patient is alert, oriented, no adenopathy, well-dressed, normal affect, normal respiratory effort. Examination patient has a good dorsalis pedis pulse.  He has no skin breakdown or abrasions he is tender to palpation laterally.  Radiographs shows widening of the medial joint space with a displaced Weber B fibular fracture.  Imaging: No results found. No images are attached to the encounter.  Labs: Lab Results  Component Value Date   HGBA1C 9.4 (H) 11/22/2015   HGBA1C 9.3 (A) 07/06/2015     No results found for: ALBUMIN, PREALBUMIN, LABURIC  Body mass index is 32.22  kg/m.  Orders:  No orders of the defined types were placed in this encounter.  No orders of the defined types were placed in this encounter.    Procedures: No procedures performed  Clinical Data: No additional findings.  ROS:  All other systems negative, except as noted in the HPI. Review of Systems  Objective: Vital Signs: Ht 5' 11"  (1.803 m)   Wt 231 lb (104.8 kg)   BMI 32.22 kg/m   Specialty Comments:  No specialty comments available.  PMFS History: Patient Active Problem List   Diagnosis Date Noted  . Type 2 diabetes mellitus with chronic kidney disease, without long-term current use of insulin ([place]) 08/14/2015  . Essential hypertension, benign 08/14/2015  . Hyperlipidemia 08/14/2015   Past Medical History:  Diagnosis Date  . Chronic kidney disease   . Diabetes mellitus without complication ([place])   . Hyperlipidemia   . Hypertension     Family History  Problem Relation Age of Onset  . Kidney disease Mother   . Kidney disease Brother     Past Surgical History:  Procedure Laterality Date  . NEPHRECTOMY     Social History   Occupational History  . Not on file  Tobacco Use  . Smoking status: Current Some Day Smoker  . Smokeless tobacco: Never Used  Substance and Sexual Activity  . Alcohol use: Yes    Alcohol/week: 0.0 oz  . Drug use: No  .  Sexual activity: Not on file

## 2018-02-17 ENCOUNTER — Other Ambulatory Visit: Payer: Self-pay

## 2018-02-17 ENCOUNTER — Encounter (HOSPITAL_COMMUNITY): Payer: Self-pay | Admitting: *Deleted

## 2018-02-17 NOTE — Progress Notes (Signed)
   02/17/18 1210  OBSTRUCTIVE SLEEP APNEA  Have you ever been diagnosed with sleep apnea through a sleep study? No  Do you snore loudly (loud enough to be heard through closed doors)?  1  Do you often feel tired, fatigued, or sleepy during the daytime (such as falling asleep during driving or talking to someone)? 0  Has anyone observed you stop breathing during your sleep? 1  Do you have, or are you being treated for high blood pressure? 1  BMI more than 35 kg/m2? 0  Age > 50 (1-yes) 0  Neck circumference greater than:Male 16 inches or larger, Male 17inches or larger?  (assess dos)  Male Gender (Yes=1) 1  Obstructive Sleep Apnea Score 4

## 2018-02-17 NOTE — Progress Notes (Signed)
Pt denies SOB, chest pain, and being under the care of a cardiologist. Pt denies having a stress test, echo and cardiac cath. Pt denies having an EKG and chest x ray within the last year. Pt denies recent labs but stated " I had an A1c 2 months ago;" records requested from Stephanie Acre, [place] at [facility]. Pt made aware to stop taking vitamins, fish oil and herbal medications. Do not take any NSAIDs ie: Ibuprofen, Advil, Naproxen (Aleve), Motrin, BC and Goody Powder. Pt verbalized understanding of all pre-op instructions.

## 2018-02-18 ENCOUNTER — Ambulatory Visit (HOSPITAL_COMMUNITY): Payer: BLUE CROSS/BLUE SHIELD | Admitting: Certified Registered"

## 2018-02-18 ENCOUNTER — Encounter (HOSPITAL_COMMUNITY): Payer: Self-pay | Admitting: Surgery

## 2018-02-18 ENCOUNTER — Ambulatory Visit (HOSPITAL_COMMUNITY)
Admission: RE | Admit: 2018-02-18 | Discharge: 2018-02-18 | Disposition: A | Discharge status: Home / Self Care | Payer: BLUE CROSS/BLUE SHIELD | Source: Ambulatory Visit | Attending: Orthopedic Surgery | Admitting: Orthopedic Surgery

## 2018-02-18 ENCOUNTER — Encounter (HOSPITAL_COMMUNITY)
Admission: RE | Disposition: A | Discharge status: Home / Self Care | Payer: Self-pay | Source: Ambulatory Visit | Attending: Orthopedic Surgery

## 2018-02-18 DIAGNOSIS — S8262XA Displaced fracture of lateral malleolus of left fibula, initial encounter for closed fracture: Secondary | ICD-10-CM | POA: Diagnosis not present

## 2018-02-18 DIAGNOSIS — W010XXA Fall on same level from slipping, tripping and stumbling without subsequent striking against object, initial encounter: Secondary | ICD-10-CM | POA: Diagnosis not present

## 2018-02-18 DIAGNOSIS — S8265XA Nondisplaced fracture of lateral malleolus of left fibula, initial encounter for closed fracture: Secondary | ICD-10-CM

## 2018-02-18 DIAGNOSIS — S82452A Displaced comminuted fracture of shaft of left fibula, initial encounter for closed fracture: Secondary | ICD-10-CM | POA: Insufficient documentation

## 2018-02-18 HISTORY — PX: ORIF ANKLE FRACTURE: SHX5408

## 2018-02-18 HISTORY — DX: Other fracture of unspecified lower leg, initial encounter for closed fracture: S82.899A

## 2018-02-18 LAB — HEMOGLOBIN A1C
Hgb A1c MFr Bld: 7.1 % — ABNORMAL HIGH (ref 4.8–5.6)
Mean Plasma Glucose: 157.07 mg/dL

## 2018-02-18 LAB — CBC
HCT: 45.2 % (ref 39.0–52.0)
HEMOGLOBIN: 15.8 g/dL (ref 13.0–17.0)
MCH: 33.4 pg (ref 26.0–34.0)
MCHC: 35 g/dL (ref 30.0–36.0)
MCV: 95.6 fL (ref 78.0–100.0)
PLATELETS: 202 10*3/uL (ref 150–400)
RBC: 4.73 MIL/uL (ref 4.22–5.81)
RDW: 12.6 % (ref 11.5–15.5)
WBC: 9 10*3/uL (ref 4.0–10.5)

## 2018-02-18 LAB — BASIC METABOLIC PANEL
Anion gap: 7 (ref 5–15)
BUN: 11 mg/dL (ref 6–20)
CHLORIDE: 104 mmol/L (ref 101–111)
CO2: 28 mmol/L (ref 22–32)
CREATININE: 1.34 mg/dL — AB (ref 0.61–1.24)
Calcium: 9.7 mg/dL (ref 8.9–10.3)
GFR calc non Af Amer: >60 mL/min (ref >60)
GLUCOSE: 161 mg/dL — AB (ref 65–99)
Potassium: 4.2 mmol/L (ref 3.5–5.1)
Sodium: 139 mmol/L (ref 135–145)

## 2018-02-18 LAB — GLUCOSE, CAPILLARY
GLUCOSE-CAPILLARY: 168 mg/dL — AB (ref 65–99)
GLUCOSE-CAPILLARY: 129 mg/dL — AB (ref 65–99)
GLUCOSE-CAPILLARY: 153 mg/dL — AB (ref 65–99)

## 2018-02-18 SURGERY — OPEN REDUCTION INTERNAL FIXATION (ORIF) ANKLE FRACTURE
Anesthesia: Monitor Anesthesia Care | Laterality: Left

## 2018-02-18 MED ORDER — HYDRALAZINE HCL 20 MG/ML IJ SOLN
INTRAMUSCULAR | Status: AC
  Filled 2018-02-18: qty 1

## 2018-02-18 MED ORDER — FENTANYL CITRATE (PF) 100 MCG/2ML IJ SOLN
INTRAMUSCULAR | Status: AC
  Administered 2018-02-18: 100 ug via INTRAVENOUS
  Filled 2018-02-18: qty 2

## 2018-02-18 MED ORDER — MIDAZOLAM HCL 2 MG/2ML IJ SOLN
INTRAMUSCULAR | Status: AC
  Filled 2018-02-18: qty 2

## 2018-02-18 MED ORDER — LACTATED RINGERS IV SOLN
INTRAVENOUS | Status: DC
  Administered 2018-02-18: 13:00:00 via INTRAVENOUS

## 2018-02-18 MED ORDER — CEFAZOLIN SODIUM-DEXTROSE 2-4 GM/100ML-% IV SOLN
2.0000 g | INTRAVENOUS | Status: AC
  Administered 2018-02-18: 2 g via INTRAVENOUS

## 2018-02-18 MED ORDER — PROPOFOL 10 MG/ML IV BOLUS
INTRAVENOUS | Status: AC
  Filled 2018-02-18: qty 20

## 2018-02-18 MED ORDER — CHLORHEXIDINE GLUCONATE 4 % EX LIQD: 60.0000 mL | Freq: Once | CUTANEOUS | Status: DC

## 2018-02-18 MED ORDER — PROPOFOL 10 MG/ML IV BOLUS
INTRAVENOUS | Status: DC | PRN
  Administered 2018-02-18: 20 mg via INTRAVENOUS

## 2018-02-18 MED ORDER — BUPIVACAINE-EPINEPHRINE (PF) 0.5% -1:200000 IJ SOLN
INTRAMUSCULAR | Status: DC | PRN
  Administered 2018-02-18: 30 mL via PERINEURAL

## 2018-02-18 MED ORDER — MIDAZOLAM HCL 5 MG/5ML IJ SOLN
INTRAMUSCULAR | Status: DC | PRN
Start: 2018-02-18 — End: 2018-02-18
  Administered 2018-02-18: 2 mg via INTRAVENOUS

## 2018-02-18 MED ORDER — ONDANSETRON HCL 4 MG/2ML IJ SOLN
INTRAMUSCULAR | Status: AC
  Filled 2018-02-18: qty 2

## 2018-02-18 MED ORDER — ACETAMINOPHEN 325 MG PO TABS: 325.0000 mg | ORAL_TABLET | ORAL | Status: DC | PRN

## 2018-02-18 MED ORDER — FENTANYL CITRATE (PF) 100 MCG/2ML IJ SOLN: 25.0000 ug | INTRAMUSCULAR | Status: DC | PRN

## 2018-02-18 MED ORDER — PROPOFOL 500 MG/50ML IV EMUL
INTRAVENOUS | Status: DC | PRN
  Administered 2018-02-18: 120 ug/kg/min via INTRAVENOUS

## 2018-02-18 MED ORDER — OXYCODONE HCL 5 MG PO TABS: 5.0000 mg | ORAL_TABLET | Freq: Once | ORAL | Status: DC | PRN

## 2018-02-18 MED ORDER — FENTANYL CITRATE (PF) 100 MCG/2ML IJ SOLN
100.0000 ug | Freq: Once | INTRAMUSCULAR | Status: AC
  Administered 2018-02-18: 100 ug via INTRAVENOUS

## 2018-02-18 MED ORDER — LIDOCAINE HCL (CARDIAC) PF 100 MG/5ML IV SOSY
PREFILLED_SYRINGE | INTRAVENOUS | Status: DC | PRN
Start: 2018-02-18 — End: 2018-02-18
  Administered 2018-02-18: 60 mg via INTRAVENOUS

## 2018-02-18 MED ORDER — CEFAZOLIN SODIUM-DEXTROSE 2-4 GM/100ML-% IV SOLN
INTRAVENOUS | Status: AC
  Filled 2018-02-18: qty 100

## 2018-02-18 MED ORDER — MIDAZOLAM HCL 2 MG/2ML IJ SOLN
INTRAMUSCULAR | Status: AC
  Administered 2018-02-18: 2 mg via INTRAVENOUS
  Filled 2018-02-18: qty 2

## 2018-02-18 MED ORDER — FENTANYL CITRATE (PF) 250 MCG/5ML IJ SOLN
INTRAMUSCULAR | Status: DC | PRN
  Administered 2018-02-18: 50 ug via INTRAVENOUS

## 2018-02-18 MED ORDER — ACETAMINOPHEN 160 MG/5ML PO SOLN: 325.0000 mg | ORAL | Status: DC | PRN

## 2018-02-18 MED ORDER — HYDRALAZINE HCL 20 MG/ML IJ SOLN
10.0000 mg | Freq: Once | INTRAMUSCULAR | Status: AC
  Administered 2018-02-18: 10 mg via INTRAVENOUS

## 2018-02-18 MED ORDER — MIDAZOLAM HCL 2 MG/2ML IJ SOLN
2.0000 mg | Freq: Once | INTRAMUSCULAR | Status: AC
  Administered 2018-02-18: 2 mg via INTRAVENOUS

## 2018-02-18 MED ORDER — ONDANSETRON HCL 4 MG/2ML IJ SOLN
INTRAMUSCULAR | Status: DC | PRN
Start: 2018-02-18 — End: 2018-02-18
  Administered 2018-02-18: 4 mg via INTRAVENOUS

## 2018-02-18 MED ORDER — OXYCODONE HCL 5 MG/5ML PO SOLN: 5.0000 mg | Freq: Once | ORAL | Status: DC | PRN

## 2018-02-18 MED ORDER — 0.9 % SODIUM CHLORIDE (POUR BTL) OPTIME
TOPICAL | Status: DC | PRN
  Administered 2018-02-18: 1000 mL

## 2018-02-18 MED ORDER — LIDOCAINE 2% (20 MG/ML) 5 ML SYRINGE
INTRAMUSCULAR | Status: AC
  Filled 2018-02-18: qty 5

## 2018-02-18 MED ORDER — HYDROCODONE-ACETAMINOPHEN 5-325 MG PO TABS: 1.0000 | ORAL_TABLET | ORAL | 0 refills | Status: DC | PRN

## 2018-02-18 MED ORDER — ROPIVACAINE HCL 7.5 MG/ML IJ SOLN
INTRAMUSCULAR | Status: DC | PRN
  Administered 2018-02-18: 12 mL via PERINEURAL

## 2018-02-18 MED ORDER — FENTANYL CITRATE (PF) 250 MCG/5ML IJ SOLN
INTRAMUSCULAR | Status: AC
  Filled 2018-02-18: qty 5

## 2018-02-18 SURGICAL SUPPLY — 47 items
BANDAGE ESMARK 6X9 LF (GAUZE/BANDAGES/DRESSINGS) IMPLANT
BIT DRILL 2.5X110 QC LCP DISP (BIT) ×2 IMPLANT
BNDG CMPR 9X6 STRL LF SNTH (GAUZE/BANDAGES/DRESSINGS)
BNDG COHESIVE 4X5 TAN STRL (GAUZE/BANDAGES/DRESSINGS) ×3 IMPLANT
BNDG ESMARK 6X9 LF (GAUZE/BANDAGES/DRESSINGS)
BNDG GAUZE ELAST 4 BULKY (GAUZE/BANDAGES/DRESSINGS) ×3 IMPLANT
COVER SURGICAL LIGHT HANDLE (MISCELLANEOUS) ×3 IMPLANT
DRAPE OEC MINIVIEW 54X84 (DRAPES) IMPLANT
DRAPE U-SHAPE 47X51 STRL (DRAPES) ×3 IMPLANT
DRSG ADAPTIC 3X8 NADH LF (GAUZE/BANDAGES/DRESSINGS) ×3 IMPLANT
DRSG PAD ABDOMINAL 8X10 ST (GAUZE/BANDAGES/DRESSINGS) ×3 IMPLANT
DURAPREP 26ML APPLICATOR (WOUND CARE) ×3 IMPLANT
ELECT REM PT RETURN 9FT ADLT (ELECTROSURGICAL) ×3
ELECTRODE REM PT RTRN 9FT ADLT (ELECTROSURGICAL) ×1 IMPLANT
GAUZE SPONGE 4X4 12PLY STRL (GAUZE/BANDAGES/DRESSINGS) ×3 IMPLANT
GAUZE SPONGE 4X4 12PLY STRL LF (GAUZE/BANDAGES/DRESSINGS) ×2 IMPLANT
GLOVE BIOGEL PI IND STRL 9 (GLOVE) ×1 IMPLANT
GLOVE BIOGEL PI INDICATOR 9 (GLOVE) ×2
GLOVE SURG ORTHO 9.0 STRL STRW (GLOVE) ×3 IMPLANT
GOWN STRL REUS W/ TWL XL LVL3 (GOWN DISPOSABLE) ×3 IMPLANT
GOWN STRL REUS W/TWL XL LVL3 (GOWN DISPOSABLE) ×9
KIT BASIN OR (CUSTOM PROCEDURE TRAY) ×3 IMPLANT
KIT TURNOVER KIT B (KITS) ×3 IMPLANT
MANIFOLD NEPTUNE II (INSTRUMENTS) ×3 IMPLANT
NS IRRIG 1000ML POUR BTL (IV SOLUTION) ×3 IMPLANT
PACK ORTHO EXTREMITY (CUSTOM PROCEDURE TRAY) ×3 IMPLANT
PAD ABD 8X10 STRL (GAUZE/BANDAGES/DRESSINGS) ×2 IMPLANT
PAD ARMBOARD 7.5X6 YLW CONV (MISCELLANEOUS) ×6 IMPLANT
PLATE LCP 3.5 1/3 TUB 8HX93 (Plate) ×2 IMPLANT
SCREW CORTEX 3.5 12MM (Screw) ×4 IMPLANT
SCREW CORTEX 3.5 16MM (Screw) ×2 IMPLANT
SCREW CORTEX 3.5 26MM (Screw) ×2 IMPLANT
SCREW LOCK CORT ST 3.5X12 (Screw) IMPLANT
SCREW LOCK CORT ST 3.5X16 (Screw) IMPLANT
SCREW LOCK CORT ST 3.5X26 (Screw) IMPLANT
SCREW LOCK T15 FT 16X3.5X2.9X (Screw) IMPLANT
SCREW LOCKING 3.5X16 (Screw) ×6 IMPLANT
STAPLER VISISTAT 35W (STAPLE) IMPLANT
SUCTION FRAZIER HANDLE 10FR (MISCELLANEOUS) ×2
SUCTION TUBE FRAZIER 10FR DISP (MISCELLANEOUS) ×1 IMPLANT
SUT ETHILON 2 0 PSLX (SUTURE) IMPLANT
SUT VIC AB 2-0 CT1 27 (SUTURE) ×3
SUT VIC AB 2-0 CT1 TAPERPNT 27 (SUTURE) ×1 IMPLANT
TOWEL OR 17X24 6PK STRL BLUE (TOWEL DISPOSABLE) ×3 IMPLANT
TOWEL OR 17X26 10 PK STRL BLUE (TOWEL DISPOSABLE) ×3 IMPLANT
TUBE CONNECTING 12'X1/4 (SUCTIONS) ×1
TUBE CONNECTING 12X1/4 (SUCTIONS) ×2 IMPLANT

## 2018-02-18 NOTE — Anesthesia Procedure Notes (Signed)
Anesthesia Regional Block: Popliteal block   Pre-Anesthetic Checklist: ,, timeout performed, Correct Patient, Correct Site, Correct Laterality, Correct Procedure, Correct Position, site marked, Risks and benefits discussed,  Surgical consent,  Pre-op evaluation,  At surgeon's request and post-op pain management  Laterality: Lower and Left  Prep: chloraprep       Needles:  Injection technique: Single-shot  Needle Type: Echogenic Stimulator Needle          Additional Needles:   Procedures:,,,, ultrasound used (permanent image in chart),,,,  Narrative:  Start time: 02/18/2018 12:16 PM End time: 02/18/2018 12:27 PM Injection made incrementally with aspirations every 5 mL.  Performed by: Personally  Anesthesiologist: Oleta Mouse, MD  Additional Notes: H+P and labs reviewed, risks and benefits discussed with patient, procedure tolerated well without complications

## 2018-02-18 NOTE — H&P (Signed)
Bryan Glenn is an 47 y.o. male.   Chief Complaint: Left ankle pain. HPI: Patient is a 47 year old gentleman with type 2 diabetes uncontrolled hemoglobin A1c greater than 9 who slipped on wet floor sustaining a Weber the displaced fibular fracture 1 week ago.  Patient was placed in a fracture boot he is currently ambulating with one cane he has been taking ibuprofen.  Patient also states that he smokes.    Past Medical History:  Diagnosis Date  . Ankle fracture    left ankle fibula fracture  . Chronic kidney disease    donated a kidney  . Diabetes mellitus without complication ([place])   . Hyperlipidemia   . Hypertension     Past Surgical History:  Procedure Laterality Date  . NEPHRECTOMY     donated a kidey    Family History  Problem Relation Age of Onset  . Kidney disease Mother   . Kidney disease Brother   . Diabetes Father    Social History:  reports that he has been smoking cigarettes.  He has never used smokeless tobacco. He reports that he drinks alcohol. He reports that he does not use drugs.  Allergies: No Known Allergies  Medications Prior to Admission  Medication Sig Dispense Refill  . blood glucose meter kit and supplies KIT Dispense based on patient and insurance preference. Use up to four times daily as directed. (FOR ICD-10 E11.65) 1 each 0  . Blood Glucose Monitoring Suppl (ACCU-CHEK AVIVA PLUS) w/Device KIT Use 4 x daily as directed. E11.65 1 kit 0  . Dulaglutide (TRULICITY) 6.22 WL/7.9GX SOPN Inject 0.75 mg into the skin every 7 (seven) days. On Sundays    . glucose blood (ACCU-CHEK AVIVA) test strip Use as instructed 4 x daily. E11.65 150 each 5  . ibuprofen (ADVIL,MOTRIN) 200 MG tablet Take 400 mg by mouth every 4 (four) hours as needed for moderate pain.    . Insulin Pen Needle (PEN NEEDLES 5/16") 30G X 8 MM MISC 1 each by Does not apply route 2 (two) times daily. 100 each 5  . Lancets (ACCU-CHEK SOFT TOUCH) lancets Use as instructed 4 x daily 150  each 2  . losartan (COZAAR) 50 MG tablet Take 1 tablet (50 mg total) by mouth daily. Take 1 1/2 tablets once daily (Patient taking differently: Take 50 mg by mouth daily. ) 45 tablet 2  . multivitamin (RENA-VIT) TABS tablet Take 1 tablet by mouth daily.    Marland Kitchen amLODipine (NORVASC) 10 MG tablet Take 1 tablet (10 mg total) by mouth daily. (Patient not taking: Reported on 02/16/2018) 30 tablet 2  . Insulin Lispro Prot & Lispro (HUMALOG MIX 75/25 KWIKPEN) (75-25) 100 UNIT/ML Kwikpen 20 units in the a.m. & 15 units in the p.m. (Patient not taking: Reported on 02/16/2018) 15 mL 2  . pravastatin (PRAVACHOL) 40 MG tablet Take 1 tablet (40 mg total) by mouth daily. (Patient not taking: Reported on 02/16/2018) 30 tablet 2  . sitaGLIPtin (JANUVIA) 25 MG tablet Take 1 tablet (25 mg total) by mouth daily. (Patient not taking: Reported on 02/16/2018) 30 tablet 2    Results for orders placed or performed during the hospital encounter of 02/18/18 (from the past 48 hour(s))  Glucose, capillary     Status: Abnormal   Collection Time: 02/18/18  9:55 AM  Result Value Ref Range   Glucose-Capillary 168 (H) 65 - 99 mg/dL  Hemoglobin A1c     Status: Abnormal   Collection Time: 02/18/18 10:22 AM  Result Value Ref Range   Hgb A1c MFr Bld 7.1 (H) 4.8 - 5.6 %    Comment: (NOTE) Pre diabetes:          5.7%-6.4% Diabetes:              >6.4% Glycemic control for   <7.0% adults with diabetes    Mean Plasma Glucose 157.07 mg/dL    Comment: Performed at [facility] [address] [place], [place] [address]  Basic metabolic panel     Status: Abnormal   Collection Time: 02/18/18 10:23 AM  Result Value Ref Range   Sodium 139 135 - 145 mmol/L   Potassium 4.2 3.5 - 5.1 mmol/L   Chloride 104 101 - 111 mmol/L   CO2 28 22 - 32 mmol/L   Glucose, Bld 161 (H) 65 - 99 mg/dL   BUN 11 6 - 20 mg/dL   Creatinine, Ser 1.34 (H) 0.61 - 1.24 mg/dL   Calcium 9.7 8.9 - 10.3 mg/dL   GFR calc non Af Amer >60 >60 mL/min   GFR calc Af  Amer >60 >60 mL/min    Comment: (NOTE) The eGFR has been calculated using the CKD EPI equation. This calculation has not been validated in all clinical situations. eGFR's persistently <60 mL/min signify possible Chronic Kidney Disease.    Anion gap 7 5 - 15    Comment: Performed at [facility] [address] [place], [place] [address]  CBC     Status: None   Collection Time: 02/18/18 10:23 AM  Result Value Ref Range   WBC 9.0 4.0 - 10.5 K/uL   RBC 4.73 4.22 - 5.81 MIL/uL   Hemoglobin 15.8 13.0 - 17.0 g/dL   HCT 45.2 39.0 - 52.0 %   MCV 95.6 78.0 - 100.0 fL   MCH 33.4 26.0 - 34.0 pg   MCHC 35.0 30.0 - 36.0 g/dL   RDW 12.6 11.5 - 15.5 %   Platelets 202 150 - 400 K/uL    Comment: Performed at [facility], [place] [address], [place], [place] [address]   No results found.  Review of Systems  All other systems reviewed and are negative.   Blood pressure (!) 177/119, pulse 98, temperature 98.6 F (37 C), temperature source Oral, resp. rate 18, height 5' 11"  (1.803 m), weight 231 lb (104.8 kg), SpO2 100 %. Physical Exam  Patient is alert, oriented, no adenopathy, well-dressed, normal affect, normal respiratory effort. Examination patient has a good dorsalis pedis pulse.  He has no skin breakdown or abrasions he is tender to palpation laterally.  Radiographs shows widening of the medial joint space with a displaced Weber B fibular fracture.   Assessment/Plan 1. Ankle fracture, left, closed, initial encounter   2. Diabetic polyneuropathy associated with type 2 diabetes mellitus ([place])     Plan: Due to the displacement of the ankle fracture recommend proceeding with open reduction internal fixation risk and benefits were discussed including infection neurovascular injury pain DVT need for additional surgery increase complications due to history of tobacco and uncontrolled diabetes.  Patient states that he understands and wishes to proceed at this time.     Newt Minion, MD 02/18/2018, 11:35 AM

## 2018-02-18 NOTE — Anesthesia Procedure Notes (Signed)
Anesthesia Regional Block: Adductor canal block   Pre-Anesthetic Checklist: ,, timeout performed, Correct Patient, Correct Site, Correct Laterality, Correct Procedure, Correct Position, site marked, Risks and benefits discussed,  Surgical consent,  Pre-op evaluation,  At surgeon's request and post-op pain management  Laterality: Lower and Left  Prep: chloraprep       Needles:  Injection technique: Single-shot  Needle Type: Echogenic Stimulator Needle          Additional Needles:   Procedures:,,,, ultrasound used (permanent image in chart),,,,  Narrative:  Start time: 02/18/2018 12:16 PM End time: 02/18/2018 12:27 PM Injection made incrementally with aspirations every 5 mL.  Performed by: Personally  Anesthesiologist: Oleta Mouse, MD  Additional Notes: H+P and labs reviewed, risks and benefits discussed with patient, procedure tolerated well without complications

## 2018-02-18 NOTE — Anesthesia Preprocedure Evaluation (Signed)
Anesthesia Evaluation  Patient identified by MRN, date of birth, ID band Patient awake    Reviewed: Allergy & Precautions, NPO status , Patient's Chart, lab work & pertinent test results  History of Anesthesia Complications Negative for: history of anesthetic complications  Airway Mallampati: II  TM Distance: >3 FB Neck ROM: Full    Dental  (+) Teeth Intact   Pulmonary Current Smoker,    breath sounds clear to auscultation       Cardiovascular hypertension, Pt. on medications (-) angina(-) Past MI and (-) CHF  Rhythm:Regular     Neuro/Psych negative neurological ROS  negative psych ROS   GI/Hepatic negative GI ROS, Neg liver ROS,   Endo/Other  diabetes, Type 2, Insulin Dependent  Renal/GU CRFRenal disease     Musculoskeletal   Abdominal   Peds  Hematology negative hematology ROS (+)   Anesthesia Other Findings   Reproductive/Obstetrics                             Anesthesia Physical Anesthesia Plan  ASA: II  Anesthesia Plan: MAC and Regional   Post-op Pain Management:    Induction: Intravenous  PONV Risk Score and Plan: 0 and Ondansetron and Treatment may vary due to age or medical condition  Airway Management Planned: Nasal Cannula  Additional Equipment: None  Intra-op Plan:   Post-operative Plan:   Informed Consent: I have reviewed the patients History and Physical, chart, labs and discussed the procedure including the risks, benefits and alternatives for the proposed anesthesia with the patient or authorized representative who has indicated his/her understanding and acceptance.   Dental advisory given  Plan Discussed with: CRNA and Surgeon  Anesthesia Plan Comments:         Anesthesia Quick Evaluation

## 2018-02-18 NOTE — Op Note (Signed)
02/18/2018  1:57 PM  PATIENT:  Bryan Glenn    PRE-OPERATIVE DIAGNOSIS:  Left Ankle Fibula Fracture  POST-OPERATIVE DIAGNOSIS:  Same  PROCEDURE:  OPEN REDUCTION INTERNAL FIXATION (ORIF) LEFT FIBULA  SURGEON:  Newt Minion, MD  PHYSICIAN ASSISTANT:None ANESTHESIA:   General  PREOPERATIVE INDICATIONS:  Bryan Glenn is a  47 y.o. male with a diagnosis of Left Ankle Fibula Fracture who failed conservative measures and elected for surgical management.    The risks benefits and alternatives were discussed with the patient preoperatively including but not limited to the risks of infection, bleeding, nerve injury, cardiopulmonary complications, the need for revision surgery, among others, and the patient was willing to proceed.  OPERATIVE IMPLANTS: 7 hole one third tubular plate  @ENCIMAGES @  OPERATIVE FINDINGS: Comminution at the fracture site intact syndesmosis  OPERATIVE PROCEDURE: Patient brought the operating room after undergoing a popliteal block.  Patient underwent a MAC anesthetic.  After adequate levels of anesthesia were obtained patient's left lower extremity was prepped using DuraPrep draped in the sterile field a timeout was called.  A lateral incision was made over the fibula.  This was carried sharply down to bone.  The fracture site was distracted cleansed debrided the joint was irrigated the fracture site was debrided.  The fibula was then brought out to length and was stabilized with a lag screw.  A antiglide plate was placed laterally 7-hole with 2 locking screws distally with 3 compression screws proximally.  C-arm fluoroscopy verified a congruent mortise.  The wound was further irrigated due to the preoperative swelling 2-0 nylon was used for wound closure.  A sterile dressing was applied patient was taken the PACU in stable condition.   DISCHARGE PLANNING:  Antibiotic duration: Preoperative  Weightbearing: Nonweightbearing on the left  Pain  medication: Prescription for Vicodin  Dressing care/ Wound VAC: Keep dressing clean and dry until follow-up in 1 week.  Ambulatory devices: Walker or crutches  Discharge to: Home  Follow-up: In the office 1 week post operative.

## 2018-02-18 NOTE — Transfer of Care (Signed)
Immediate Anesthesia Transfer of Care Note  Patient: Bryan Glenn  Procedure(s) Performed: OPEN REDUCTION INTERNAL FIXATION (ORIF) LEFT FIBULA (Left )  Patient Location: PACU  Anesthesia Type:MAC combined with regional for post-op pain  Level of Consciousness: awake, alert  and oriented  Airway & Oxygen Therapy: Patient Spontanous Breathing  Post-op Assessment: Report given to RN and Post -op Vital signs reviewed and stable  Post vital signs: Reviewed and stable  Last Vitals:  Vitals Value Taken Time  BP 140/88 02/18/2018  2:01 PM  Temp    Pulse    Resp 24 02/18/2018  2:02 PM  SpO2    Vitals shown include unvalidated device data.  Last Pain:  Vitals:   02/18/18 1007  TempSrc:   PainSc: 0-No pain      Patients Stated Pain Goal: 3 (27/07/86 7544)  Complications: No apparent anesthesia complications

## 2018-02-18 NOTE — Anesthesia Postprocedure Evaluation (Signed)
Anesthesia Post Note  Patient: EMANUELE MCWHIRTER  Procedure(s) Performed: OPEN REDUCTION INTERNAL FIXATION (ORIF) LEFT FIBULA (Left )     Patient location during evaluation: PACU Anesthesia Type: Regional and MAC Level of consciousness: awake and alert Pain management: pain level controlled Vital Signs Assessment: post-procedure vital signs reviewed and stable Respiratory status: spontaneous breathing, nonlabored ventilation, respiratory function stable and patient connected to nasal cannula oxygen Cardiovascular status: stable and blood pressure returned to baseline Postop Assessment: no apparent nausea or vomiting Anesthetic complications: no    Last Vitals:  Vitals:   02/18/18 1435 02/18/18 1447  BP: (!) 145/95 (!) 146/97  Pulse: 68 79  Resp: 13 14  Temp: 36.9 C   SpO2: 100% 100%    Last Pain:  Vitals:   02/18/18 1447  TempSrc:   PainSc: 0-No pain                 Zade Baylea Milburn

## 2018-02-18 NOTE — Anesthesia Procedure Notes (Signed)
Procedure Name: MAC Date/Time: 02/18/2018 1:49 PM Performed by: Teressa Lower., CRNA Pre-anesthesia Checklist: Patient identified, Emergency Drugs available, Suction available, Patient being monitored and Timeout performed Patient Re-evaluated:Patient Re-evaluated prior to induction Oxygen Delivery Method: Simple face mask Ventilation: Oral airway inserted - appropriate to patient size

## 2018-02-19 ENCOUNTER — Encounter (HOSPITAL_COMMUNITY): Payer: Self-pay | Admitting: Orthopedic Surgery

## 2018-03-04 ENCOUNTER — Ambulatory Visit (INDEPENDENT_AMBULATORY_CARE_PROVIDER_SITE_OTHER): Payer: BLUE CROSS/BLUE SHIELD

## 2018-03-04 ENCOUNTER — Ambulatory Visit (INDEPENDENT_AMBULATORY_CARE_PROVIDER_SITE_OTHER): Payer: BLUE CROSS/BLUE SHIELD | Admitting: Orthopedic Surgery

## 2018-03-04 ENCOUNTER — Encounter (INDEPENDENT_AMBULATORY_CARE_PROVIDER_SITE_OTHER): Payer: Self-pay | Admitting: Orthopedic Surgery

## 2018-03-04 VITALS — Ht 71.0 in | Wt 231.0 lb

## 2018-03-04 DIAGNOSIS — M79605 Pain in left leg: Secondary | ICD-10-CM

## 2018-03-04 DIAGNOSIS — S82892A Other fracture of left lower leg, initial encounter for closed fracture: Secondary | ICD-10-CM

## 2018-03-04 DIAGNOSIS — S8265XA Nondisplaced fracture of lateral malleolus of left fibula, initial encounter for closed fracture: Secondary | ICD-10-CM

## 2018-03-04 NOTE — Progress Notes (Signed)
Office Visit Note   Patient: Bryan Glenn           Date of Birth: 10/03/1970           MRN: [id number] Visit Date: 03/04/2018              Requested by: Adrian Prows, [place] [address] PCP: Adrian Prows, PA-C  Chief Complaint  Patient presents with  . Left Leg - Routine Post Op    02/18/18 ORIF left fib      HPI: Patient is a 47 year old gentleman 2 weeks status post open reduction internal fixation left fibular fracture Weber B fracture.    Assessment & Plan: Visit Diagnoses:  1. Nondisplaced fracture of lateral malleolus of left fibula, initial encounter for closed fracture   2. Left leg pain   3. Ankle fracture, left, closed, initial encounter     Plan: Sutures are harvested patient was given instructions for Achilles stretching he will continue nonweightbearing he will wear a knee-high medical compression stocking.    Follow-up in 2 weeks at which time we will obtain three-view radiographs of the left ankle and patient may be discharged to work with weightbearing as tolerated.  Follow-Up Instructions: Return in about 2 weeks (around 03/18/2018).   Ortho Exam  Patient is alert, oriented, no adenopathy, well-dressed, normal affect, normal respiratory effort. Examination patient does have swelling laterally.  There is no wound dehiscence.  We will harvest the sutures today.  Patient will need medical compression stockings to decrease the venous swelling.  Patient was given instruction and demonstrated Achilles stretching.  Patient does not have light duty work and will keep him out of work until follow-up and at that time he may be weightbearing as tolerated.  Imaging: Xr Tibia/fibula Left  Result Date: 03/04/2018 2 view radiographs of the left ankle shows stable internal fixation of the fibula fracture the fibula is out to length the mortise is congruent no widening of the syndesmosis.  No images are attached to the  encounter.  Labs: Lab Results  Component Value Date   HGBA1C 7.1 (H) 02/18/2018   HGBA1C 9.4 (H) 11/22/2015   HGBA1C 9.3 (A) 07/06/2015     No results found for: ALBUMIN, PREALBUMIN, LABURIC  Body mass index is 32.22 kg/m.  Orders:  Orders Placed This Encounter  Procedures  . XR Tibia/Fibula Left   No orders of the defined types were placed in this encounter.    Procedures: No procedures performed  Clinical Data: No additional findings.  ROS:  All other systems negative, except as noted in the HPI. Review of Systems  Objective: Vital Signs: Ht 5' 11"  (1.803 m)   Wt 231 lb (104.8 kg)   BMI 32.22 kg/m   Specialty Comments:  No specialty comments available.  PMFS History: Patient Active Problem List   Diagnosis Date Noted  . Closed displaced fracture of lateral malleolus of left fibula   . Type 2 diabetes mellitus with chronic kidney disease, without long-term current use of insulin ([place] [facility]) 08/14/2015  . Essential hypertension, benign 08/14/2015  . Hyperlipidemia 08/14/2015   Past Medical History:  Diagnosis Date  . Ankle fracture    left ankle fibula fracture  . Chronic kidney disease    donated a kidney  . Diabetes mellitus without complication ([place])   . Hyperlipidemia   . Hypertension     Family History  Problem Relation Age of Onset  . Kidney disease Mother   .  Kidney disease Brother   . Diabetes Father     Past Surgical History:  Procedure Laterality Date  . NEPHRECTOMY     donated a kidey  . ORIF ANKLE FRACTURE Left 02/18/2018   Procedure: OPEN REDUCTION INTERNAL FIXATION (ORIF) LEFT FIBULA;  Surgeon: Newt Minion, MD;  Location: [facility];  Service: Orthopedics;  Laterality: Left;   Social History   Occupational History  . Not on file  Tobacco Use  . Smoking status: Current Some Day Smoker    Types: Cigarettes  . Smokeless tobacco: Never Used  Substance and Sexual Activity  . Alcohol use: Yes    Alcohol/week: 0.0 oz    Comment:  social  . Drug use: No  . Sexual activity: Not on file

## 2018-03-18 ENCOUNTER — Ambulatory Visit (INDEPENDENT_AMBULATORY_CARE_PROVIDER_SITE_OTHER): Payer: BLUE CROSS/BLUE SHIELD | Admitting: Family

## 2018-03-18 ENCOUNTER — Ambulatory Visit (INDEPENDENT_AMBULATORY_CARE_PROVIDER_SITE_OTHER): Payer: BLUE CROSS/BLUE SHIELD

## 2018-03-18 ENCOUNTER — Encounter (INDEPENDENT_AMBULATORY_CARE_PROVIDER_SITE_OTHER): Payer: Self-pay | Admitting: Family

## 2018-03-18 VITALS — Ht 71.0 in | Wt 231.0 lb

## 2018-03-18 DIAGNOSIS — S8265XA Nondisplaced fracture of lateral malleolus of left fibula, initial encounter for closed fracture: Secondary | ICD-10-CM

## 2018-03-18 NOTE — Progress Notes (Signed)
Office Visit Note   Patient: Bryan Glenn           Date of Birth: [birth year]           MRN: [id number] Visit Date: 03/18/2018              Requested by: Adrian Prows, [place] [place] [address] PCP: Adrian Prows, PA-C  Chief Complaint  Patient presents with  . Left Ankle - Routine Post Op    02/18/18 ORIF left fib      HPI: Patient is a 47 year old gentleman 4 weeks status post open reduction internal fixation left fibular fracture Weber B fracture. Pleased with his progress.  Assessment & Plan: Visit Diagnoses:  1. Nondisplaced fracture of lateral malleolus of left fibula, initial encounter for closed fracture     Plan: Patient to be discharged to work with weightbearing as tolerated. May discontinue CAM walker, given ASO for work.   Follow-Up Instructions: Return in about 1 month (around 04/15/2018).   Ortho Exam  Patient is alert, oriented, no adenopathy, well-dressed, normal affect, normal respiratory effort. Examination patient does have swelling laterally.  Incision well healed. No erythema. Mild tenderness. Imaging: Xr Ankle Complete Left  Result Date: 03/18/2018 Radiographs of the left ankle shows stable internal fixation of the fibula fracture. the fibula is out to length. the mortise is congruent. no widening of the syndesmosis.  No images are attached to the encounter.  Labs: Lab Results  Component Value Date   HGBA1C 7.1 (H) 02/18/2018   HGBA1C 9.4 (H) 11/22/2015   HGBA1C 9.3 (A) 07/06/2015     No results found for: ALBUMIN, PREALBUMIN, LABURIC  Body mass index is 32.22 kg/m.  Orders:  Orders Placed This Encounter  Procedures  . XR Ankle Complete Left   No orders of the defined types were placed in this encounter.    Procedures: No procedures performed  Clinical Data: No additional findings.  ROS:  All other systems negative, except as noted in the HPI. Review of Systems  Constitutional:  Negative for chills and fever.    Objective: Vital Signs: Ht 5' 11"  (1.803 m)   Wt 231 lb (104.8 kg)   BMI 32.22 kg/m   Specialty Comments:  No specialty comments available.  PMFS History: Patient Active Problem List   Diagnosis Date Noted  . Closed displaced fracture of lateral malleolus of left fibula   . Type 2 diabetes mellitus with chronic kidney disease, without long-term current use of insulin ([facility]) 08/14/2015  . Essential hypertension, benign 08/14/2015  . Hyperlipidemia 08/14/2015   Past Medical History:  Diagnosis Date  . Ankle fracture    left ankle fibula fracture  . Chronic kidney disease    donated a kidney  . Diabetes mellitus without complication ([facility])   . Hyperlipidemia   . Hypertension     Family History  Problem Relation Age of Onset  . Kidney disease Mother   . Kidney disease Brother   . Diabetes Father     Past Surgical History:  Procedure Laterality Date  . NEPHRECTOMY     donated a kidey  . ORIF ANKLE FRACTURE Left 02/18/2018   Procedure: OPEN REDUCTION INTERNAL FIXATION (ORIF) LEFT FIBULA;  Surgeon: Newt Minion, MD;  Location: [facility];  Service: Orthopedics;  Laterality: Left;   Social History   Occupational History  . Not on file  Tobacco Use  . Smoking status: Current Some Day Smoker  Types: Cigarettes  . Smokeless tobacco: Never Used  Substance and Sexual Activity  . Alcohol use: Yes    Alcohol/week: 0.0 oz    Comment: social  . Drug use: No  . Sexual activity: Not on file

## 2019-06-15 ENCOUNTER — Other Ambulatory Visit: Payer: Self-pay

## 2019-06-15 ENCOUNTER — Ambulatory Visit (INDEPENDENT_AMBULATORY_CARE_PROVIDER_SITE_OTHER): Payer: BC Managed Care – PPO | Admitting: Licensed Clinical Social Worker

## 2019-06-15 DIAGNOSIS — F1020 Alcohol dependence, uncomplicated: Secondary | ICD-10-CM
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
## 2019-06-16 ENCOUNTER — Telehealth (HOSPITAL_COMMUNITY): Payer: Self-pay | Admitting: Licensed Clinical Social Worker

## 2019-10-21 ENCOUNTER — Ambulatory Visit: Payer: Self-pay

## 2019-10-25 ENCOUNTER — Ambulatory Visit: Payer: Self-pay | Attending: Family

## 2019-10-25 DIAGNOSIS — Z23 Encounter for immunization: Secondary | ICD-10-CM | POA: Insufficient documentation

## 2019-10-25 NOTE — Progress Notes (Signed)
   Covid-19 Vaccination Clinic  Name:  Bryan Glenn    MRN: [id number] DOB: [birth year]  10/25/2019  [name] was observed post Covid-19 immunization for 15 minutes without incidence. He was provided with Vaccine Information Sheet and instruction to access the V-Safe system.   [name] was instructed to call 911 with any severe reactions post vaccine: Marland Kitchen Difficulty breathing  . Swelling of your face and throat  . A fast heartbeat  . A bad rash all over your body  . Dizziness and weakness    Immunizations Administered    Name Date Dose VIS Date Route   Moderna COVID-19 Vaccine 10/25/2019 11:11 AM 0.5 mL 08/03/2019 Intramuscular   Manufacturer: Moderna   Lot: [id number]   [place]: 10932-355-73

## 2019-10-26 ENCOUNTER — Ambulatory Visit: Payer: Self-pay | Admitting: Internal Medicine

## 2019-11-23 ENCOUNTER — Ambulatory Visit: Payer: Self-pay | Attending: Family

## 2019-11-23 DIAGNOSIS — Z23 Encounter for immunization: Secondary | ICD-10-CM

## 2019-11-23 NOTE — Progress Notes (Signed)
   Covid-19 Vaccination Clinic  Name:  Bryan Glenn    MRN: [id number] DOB: December 03, 1970  11/23/2019  Mr. Hoglund was observed post Covid-19 immunization for 15 minutes without incident. He was provided with Vaccine Information Sheet and instruction to access the V-Safe system.   Mr. Linder was instructed to call 911 with any severe reactions post vaccine: Marland Kitchen Difficulty breathing  . Swelling of face and throat  . A fast heartbeat  . A bad rash all over body  . Dizziness and weakness   Immunizations Administered    Name Date Dose VIS Date Route   Moderna COVID-19 Vaccine 11/23/2019  1:59 PM 0.5 mL 08/03/2019 Intramuscular   Manufacturer: Moderna   Lot: [id number]   [place]: [id number]

## 2019-12-07 ENCOUNTER — Other Ambulatory Visit: Payer: Self-pay

## 2019-12-08 ENCOUNTER — Ambulatory Visit: Payer: BC Managed Care – PPO | Admitting: Internal Medicine

## 2019-12-21 ENCOUNTER — Ambulatory Visit (INDEPENDENT_AMBULATORY_CARE_PROVIDER_SITE_OTHER): Payer: BC Managed Care – PPO | Admitting: Internal Medicine

## 2019-12-21 ENCOUNTER — Other Ambulatory Visit: Payer: Self-pay

## 2019-12-21 ENCOUNTER — Encounter: Payer: Self-pay | Admitting: Internal Medicine

## 2019-12-21 VITALS — BP 150/100 | HR 93 | Temp 97.8°F | Ht 71.0 in | Wt 187.9 lb

## 2019-12-21 DIAGNOSIS — E1122 Type 2 diabetes mellitus with diabetic chronic kidney disease: Secondary | ICD-10-CM | POA: Diagnosis not present

## 2019-12-21 DIAGNOSIS — I10 Essential (primary) hypertension: Secondary | ICD-10-CM

## 2019-12-21 DIAGNOSIS — E785 Hyperlipidemia, unspecified: Secondary | ICD-10-CM | POA: Diagnosis not present

## 2019-12-21 DIAGNOSIS — Z720 Tobacco use: Secondary | ICD-10-CM | POA: Diagnosis not present

## 2019-12-21 LAB — POCT GLYCOSYLATED HEMOGLOBIN (HGB A1C): Hemoglobin A1C: 10.4 % — AB (ref 4.0–5.6)

## 2019-12-21 MED ORDER — TRULICITY 1.5 MG/0.5ML ~~LOC~~ SOAJ: 1.5000 mg | SUBCUTANEOUS | 3 refills | Status: DC

## 2019-12-21 MED ORDER — METOPROLOL TARTRATE 25 MG PO TABS: 25.0000 mg | ORAL_TABLET | Freq: Two times a day (BID) | ORAL | 1 refills | Status: DC

## 2019-12-21 NOTE — Progress Notes (Signed)
New Patient Office Visit     This visit occurred during the SARS-CoV-2 public health emergency.  Safety protocols were in place, including screening questions prior to the visit, additional usage of staff PPE, and extensive cleaning of exam room while observing appropriate contact time as indicated for disinfecting solutions.    CC/Reason for Visit: Establish care, discuss chronic conditions Previous PCP: Various local urgent cares Last Visit: Last year  HPI: Bryan Glenn is a 49 y.o. male who is coming in today for the above mentioned reasons. Past Medical History is significant for: Uncontrolled diabetes and hypertension, history of hyperlipidemia.  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  He has not had any recent vaccinations or cancer screenings or preventative healthcare.  He has no complaints today.   Past Medical/Surgical History: Past Medical History:  Diagnosis Date  . Ankle fracture    left ankle fibula fracture  . Chronic kidney disease    donated a kidney  . Diabetes mellitus without complication ([place])   . Hyperlipidemia   . Hypertension     Past Surgical History:  Procedure Laterality Date  . NEPHRECTOMY     donated a kidey  . ORIF ANKLE FRACTURE Left 02/18/2018   Procedure: OPEN REDUCTION INTERNAL FIXATION (ORIF) LEFT FIBULA;  Surgeon: Newt Minion, MD;  Location: [facility];  Service: Orthopedics;  Laterality: Left;    Social History:  reports that he has been smoking cigarettes. He has never used smokeless tobacco. He reports current alcohol use. He reports that he does not use drugs.  Allergies: No Known Allergies  Family History:  Family History  Problem Relation Age of Onset  . Kidney disease Mother   . Kidney disease Brother   . Diabetes Father      Current Outpatient Medications:  .  amLODipine  (NORVASC) 10 MG tablet, Take 1 tablet (10 mg total) by mouth daily., Disp: 30 tablet, Rfl: 2 .  blood glucose meter kit and supplies KIT, Dispense based on patient and insurance preference. Use up to four times daily as directed. (FOR ICD-10 E11.65), Disp: 1 each, Rfl: 0 .  Blood Glucose Monitoring Suppl (ACCU-CHEK AVIVA PLUS) w/Device KIT, Use 4 x daily as directed. E11.65, Disp: 1 kit, Rfl: 0 .  glucose blood (ACCU-CHEK AVIVA) test strip, Use as instructed 4 x daily. E11.65, Disp: 150 each, Rfl: 5 .  ibuprofen (ADVIL,MOTRIN) 200 MG tablet, Take 400 mg by mouth every 4 (four) hours as needed for moderate pain., Disp: , Rfl:  .  Insulin Pen Needle (PEN NEEDLES 5/16") 30G X 8 MM MISC, 1 each by Does not apply route 2 (two) times daily., Disp: 100 each, Rfl: 5 .  Lancets (ACCU-CHEK SOFT TOUCH) lancets, Use as instructed 4 x daily, Disp: 150 each, Rfl: 2 .  losartan (COZAAR) 50 MG tablet, Take 1 tablet (50 mg total) by mouth daily. Take 1 1/2 tablets once daily (Patient taking differently: Take 50 mg by mouth daily. ), Disp: 45 tablet, Rfl: 2 .  multivitamin (RENA-VIT) TABS tablet, Take 1 tablet by mouth daily., Disp: , Rfl:  .  pravastatin (PRAVACHOL) 40 MG tablet, Take 1 tablet (40 mg total) by mouth daily., Disp: 30 tablet, Rfl: 2 .  Dulaglutide (TRULICITY) 1.5 HE/5.2DP SOPN, Inject 1.5 mg into the skin once a week., Disp: 4 pen,  Rfl: 3 .  metoprolol tartrate (LOPRESSOR) 25 MG tablet, Take 1 tablet (25 mg total) by mouth 2 (two) times daily., Disp: 180 tablet, Rfl: 1  Review of Systems:  Constitutional: Denies fever, chills, diaphoresis, appetite change and fatigue.  HEENT: Denies photophobia, eye pain, redness, hearing loss, ear pain, congestion, sore throat, rhinorrhea, sneezing, mouth sores, trouble swallowing, neck pain, neck stiffness and tinnitus.   Respiratory: Denies SOB, DOE, cough, chest tightness,  and wheezing.   Cardiovascular: Denies chest pain, palpitations and leg swelling.    Gastrointestinal: Denies nausea, vomiting, abdominal pain, diarrhea, constipation, blood in stool and abdominal distention.  Genitourinary: Denies dysuria, urgency, frequency, hematuria, flank pain and difficulty urinating.  Endocrine: Denies: hot or cold intolerance, sweats, changes in hair or nails, polyuria, polydipsia. Musculoskeletal: Denies myalgias, back pain, joint swelling, arthralgias and gait problem.  Skin: Denies pallor, rash and wound.  Neurological: Denies dizziness, seizures, syncope, weakness, light-headedness, numbness and headaches.  Hematological: Denies adenopathy. Easy bruising, personal or family bleeding history  Psychiatric/Behavioral: Denies suicidal ideation, mood changes, confusion, nervousness, sleep disturbance and agitation    Physical Exam: Vitals:   12/21/19 1401  BP: (!) 150/100  Pulse: 93  Temp: 97.8 F (36.6 C)  TempSrc: Temporal  SpO2: 98%  Weight: 187 lb 14.4 oz (85.2 kg)  Height: 5' 11"  (1.803 m)   Body mass index is 26.21 kg/m.  Constitutional: NAD, calm, comfortable Eyes: PERRL, lids and conjunctivae normal, wears corrective lenses ENMT: Mucous membranes are moist.  Respiratory: clear to auscultation bilaterally, no wheezing, no crackles. Normal respiratory effort. No accessory muscle use.  Cardiovascular: Regular rate and rhythm, no murmurs / rubs / gallops. No extremity edema.   Neurologic: Grossly intact and nonfocal Psychiatric: Normal judgment and insight. Alert and oriented x 3. Normal mood.    Impression and Plan:  Type 2 diabetes mellitus with chronic kidney disease, without long-term current use of insulin, unspecified CKD stage ([place])  -Very uncontrolled with an A1c of 10.4 today. -He is only on Trulicity 8.89, was taken off Metformin after his kidney donation. -Increase Trulicity to 1.5 weekly today. -Follow-up in 3 months for repeat A1c. -We have discussed lifestyle modifications at night today.  If A1c still above goal,  may need to consider starting insulin.  Essential hypertension, benign  -Very uncontrolled with a blood pressure 150/100. -Add metoprolol 25 mg twice daily to his losartan. -Follow-up in 3 months. -He has been counseled on a low-salt diet and on importance of ambulatory blood pressure monitoring.  Hyperlipidemia, unspecified hyperlipidemia type -Refill rosuvastatin, check lipids when he returns for physical.  I do not have an LDL on file for him.  Tobacco abuse -He smokes 5 cigarettes/day, used to be a heavier smoker. -Have not had time to discuss this today, will address at subsequent.     Patient Instructions  -Nice seeing you today!!  -Increase trulicity to 1.5 mg injected weekly.  -Start metoprolol 25 mg twice daily.  -Schedule follow up in 3 months for your physical. Please come in fasting that day for lab work.   Diabetes Mellitus and Nutrition, Adult When you have diabetes (diabetes mellitus), it is very important to have healthy eating habits because your blood sugar (glucose) levels are greatly affected by what you eat and drink. Eating healthy foods in the appropriate amounts, at about the same times every day, can help you:  Control your blood glucose.  Lower your risk of heart disease.  Improve your blood pressure.  Reach or maintain  a healthy weight. Every person with diabetes is different, and each person has different needs for a meal plan. Your health care provider may recommend that you work with a diet and nutrition specialist (dietitian) to make a meal plan that is best for you. Your meal plan may vary depending on factors such as:  The calories you need.  The medicines you take.  Your weight.  Your blood glucose, blood pressure, and cholesterol levels.  Your activity level.  Other health conditions you have, such as heart or kidney disease. How do carbohydrates affect me? Carbohydrates, also called carbs, affect your blood glucose level more  than any other type of food. Eating carbs naturally raises the amount of glucose in your blood. Carb counting is a method for keeping track of how many carbs you eat. Counting carbs is important to keep your blood glucose at a healthy level, especially if you use insulin or take certain oral diabetes medicines. It is important to know how many carbs you can safely have in each meal. This is different for every person. Your dietitian can help you calculate how many carbs you should have at each meal and for each snack. Foods that contain carbs include:  Bread, cereal, rice, pasta, and crackers.  Potatoes and corn.  Peas, beans, and lentils.  Milk and yogurt.  Fruit and juice.  Desserts, such as cakes, cookies, ice cream, and candy. How does alcohol affect me? Alcohol can cause a sudden decrease in blood glucose (hypoglycemia), especially if you use insulin or take certain oral diabetes medicines. Hypoglycemia can be a life-threatening condition. Symptoms of hypoglycemia (sleepiness, dizziness, and confusion) are similar to symptoms of having too much alcohol. If your health care provider says that alcohol is safe for you, follow these guidelines:  Limit alcohol intake to no more than 1 drink per day for nonpregnant women and 2 drinks per day for men. One drink equals 12 oz of beer, 5 oz of wine, or 1 oz of hard liquor.  Do not drink on an empty stomach.  Keep yourself hydrated with water, diet soda, or unsweetened iced tea.  Keep in mind that regular soda, juice, and other mixers may contain a lot of sugar and must be counted as carbs. What are tips for following this plan?  Reading food labels  Start by checking the serving size on the "Nutrition Facts" label of packaged foods and drinks. The amount of calories, carbs, fats, and other nutrients listed on the label is based on one serving of the item. Many items contain more than one serving per package.  Check the total grams (g) of  carbs in one serving. You can calculate the number of servings of carbs in one serving by dividing the total carbs by 15. For example, if a food has 30 g of total carbs, it would be equal to 2 servings of carbs.  Check the number of grams (g) of saturated and trans fats in one serving. Choose foods that have low or no amount of these fats.  Check the number of milligrams (mg) of salt (sodium) in one serving. Most people should limit total sodium intake to less than 2,300 mg per day.  Always check the nutrition information of foods labeled as "low-fat" or "nonfat". These foods may be higher in added sugar or refined carbs and should be avoided.  Talk to your dietitian to identify your daily goals for nutrients listed on the label. Shopping  Avoid buying canned, premade,  or processed foods. These foods tend to be high in fat, sodium, and added sugar.  Shop around the outside edge of the grocery store. This includes fresh fruits and vegetables, bulk grains, fresh meats, and fresh dairy. Cooking  Use low-heat cooking methods, such as baking, instead of high-heat cooking methods like deep frying.  Cook using healthy oils, such as olive, canola, or sunflower oil.  Avoid cooking with butter, cream, or high-fat meats. Meal planning  Eat meals and snacks regularly, preferably at the same times every day. Avoid going long periods of time without eating.  Eat foods high in fiber, such as fresh fruits, vegetables, beans, and whole grains. Talk to your dietitian about how many servings of carbs you can eat at each meal.  Eat 4-6 ounces (oz) of lean protein each day, such as lean meat, chicken, fish, eggs, or tofu. One oz of lean protein is equal to: ? 1 oz of meat, chicken, or fish. ? 1 egg. ?  cup of tofu.  Eat some foods each day that contain healthy fats, such as avocado, nuts, seeds, and fish. Lifestyle  Check your blood glucose regularly.  Exercise regularly as told by your health care  provider. This may include: ? 150 minutes of moderate-intensity or vigorous-intensity exercise each week. This could be brisk walking, biking, or water aerobics. ? Stretching and doing strength exercises, such as yoga or weightlifting, at least 2 times a week.  Take medicines as told by your health care provider.  Do not use any products that contain nicotine or tobacco, such as cigarettes and e-cigarettes. If you need help quitting, ask your health care provider.  Work with a Social worker or diabetes educator to identify strategies to manage stress and any emotional and social challenges. Questions to ask a health care provider  Do I need to meet with a diabetes educator?  Do I need to meet with a dietitian?  What number can I call if I have questions?  When are the best times to check my blood glucose? Where to find more information:  American Diabetes Association: diabetes.org  Academy of Nutrition and Dietetics: www.eatright.[employer] of Diabetes and Digestive and Kidney Diseases (NIH): [employer] Summary  A healthy meal plan will help you control your blood glucose and maintain a healthy lifestyle.  Working with a diet and nutrition specialist (dietitian) can help you make a meal plan that is best for you.  Keep in mind that carbohydrates (carbs) and alcohol have immediate effects on your blood glucose levels. It is important to count carbs and to use alcohol carefully. This information is not intended to replace advice given to you by your health care provider. Make sure you discuss any questions you have with your health care provider. Document Revised: 08/01/2017 Document Reviewed: 09/23/2016 Elsevier Patient Education  2020 [place], [place] [facility]

## 2019-12-21 NOTE — Patient Instructions (Addendum)
-Nice seeing you today!!  -Increase trulicity to 1.5 mg injected weekly.  -Start metoprolol 25 mg twice daily.  -Schedule follow up in 3 months for your physical. Please come in fasting that day for lab work.   Diabetes Mellitus and Nutrition, Adult When you have diabetes (diabetes mellitus), it is very important to have healthy eating habits because your blood sugar (glucose) levels are greatly affected by what you eat and drink. Eating healthy foods in the appropriate amounts, at about the same times every day, can help you:  Control your blood glucose.  Lower your risk of heart disease.  Improve your blood pressure.  Reach or maintain a healthy weight. Every person with diabetes is different, and each person has different needs for a meal plan. Your health care provider may recommend that you work with a diet and nutrition specialist (dietitian) to make a meal plan that is best for you. Your meal plan may vary depending on factors such as:  The calories you need.  The medicines you take.  Your weight.  Your blood glucose, blood pressure, and cholesterol levels.  Your activity level.  Other health conditions you have, such as heart or kidney disease. How do carbohydrates affect me? Carbohydrates, also called carbs, affect your blood glucose level more than any other type of food. Eating carbs naturally raises the amount of glucose in your blood. Carb counting is a method for keeping track of how many carbs you eat. Counting carbs is important to keep your blood glucose at a healthy level, especially if you use insulin or take certain oral diabetes medicines. It is important to know how many carbs you can safely have in each meal. This is different for every person. Your dietitian can help you calculate how many carbs you should have at each meal and for each snack. Foods that contain carbs include:  Bread, cereal, rice, pasta, and crackers.  Potatoes and corn.  Peas, beans,  and lentils.  Milk and yogurt.  Fruit and juice.  Desserts, such as cakes, cookies, ice cream, and candy. How does alcohol affect me? Alcohol can cause a sudden decrease in blood glucose (hypoglycemia), especially if you use insulin or take certain oral diabetes medicines. Hypoglycemia can be a life-threatening condition. Symptoms of hypoglycemia (sleepiness, dizziness, and confusion) are similar to symptoms of having too much alcohol. If your health care provider says that alcohol is safe for you, follow these guidelines:  Limit alcohol intake to no more than 1 drink per day for nonpregnant women and 2 drinks per day for men. One drink equals 12 oz of beer, 5 oz of wine, or 1 oz of hard liquor.  Do not drink on an empty stomach.  Keep yourself hydrated with water, diet soda, or unsweetened iced tea.  Keep in mind that regular soda, juice, and other mixers may contain a lot of sugar and must be counted as carbs. What are tips for following this plan?  Reading food labels  Start by checking the serving size on the "Nutrition Facts" label of packaged foods and drinks. The amount of calories, carbs, fats, and other nutrients listed on the label is based on one serving of the item. Many items contain more than one serving per package.  Check the total grams (g) of carbs in one serving. You can calculate the number of servings of carbs in one serving by dividing the total carbs by 15. For example, if a food has 30 g of total carbs,  it would be equal to 2 servings of carbs.  Check the number of grams (g) of saturated and trans fats in one serving. Choose foods that have low or no amount of these fats.  Check the number of milligrams (mg) of salt (sodium) in one serving. Most people should limit total sodium intake to less than 2,300 mg per day.  Always check the nutrition information of foods labeled as "low-fat" or "nonfat". These foods may be higher in added sugar or refined carbs and  should be avoided.  Talk to your dietitian to identify your daily goals for nutrients listed on the label. Shopping  Avoid buying canned, premade, or processed foods. These foods tend to be high in fat, sodium, and added sugar.  Shop around the outside edge of the grocery store. This includes fresh fruits and vegetables, bulk grains, fresh meats, and fresh dairy. Cooking  Use low-heat cooking methods, such as baking, instead of high-heat cooking methods like deep frying.  Cook using healthy oils, such as olive, canola, or sunflower oil.  Avoid cooking with butter, cream, or high-fat meats. Meal planning  Eat meals and snacks regularly, preferably at the same times every day. Avoid going long periods of time without eating.  Eat foods high in fiber, such as fresh fruits, vegetables, beans, and whole grains. Talk to your dietitian about how many servings of carbs you can eat at each meal.  Eat 4-6 ounces (oz) of lean protein each day, such as lean meat, chicken, fish, eggs, or tofu. One oz of lean protein is equal to: ? 1 oz of meat, chicken, or fish. ? 1 egg. ?  cup of tofu.  Eat some foods each day that contain healthy fats, such as avocado, nuts, seeds, and fish. Lifestyle  Check your blood glucose regularly.  Exercise regularly as told by your health care provider. This may include: ? 150 minutes of moderate-intensity or vigorous-intensity exercise each week. This could be brisk walking, biking, or water aerobics. ? Stretching and doing strength exercises, such as yoga or weightlifting, at least 2 times a week.  Take medicines as told by your health care provider.  Do not use any products that contain nicotine or tobacco, such as cigarettes and e-cigarettes. If you need help quitting, ask your health care provider.  Work with a Social worker or diabetes educator to identify strategies to manage stress and any emotional and social challenges. Questions to ask a health care  provider  Do I need to meet with a diabetes educator?  Do I need to meet with a dietitian?  What number can I call if I have questions?  When are the best times to check my blood glucose? Where to find more information:  American Diabetes Association: diabetes.org  Academy of Nutrition and Dietetics: www.eatright.[employer] of Diabetes and Digestive and Kidney Diseases (NIH): [employer] Summary  A healthy meal plan will help you control your blood glucose and maintain a healthy lifestyle.  Working with a diet and nutrition specialist (dietitian) can help you make a meal plan that is best for you.  Keep in mind that carbohydrates (carbs) and alcohol have immediate effects on your blood glucose levels. It is important to count carbs and to use alcohol carefully. This information is not intended to replace advice given to you by your health care provider. Make sure you discuss any questions you have with your health care provider. Document Revised: 08/01/2017 Document Reviewed: 09/23/2016 Elsevier Patient Education  2020 [employer]  

## 2020-03-22 ENCOUNTER — Encounter: Payer: Self-pay | Admitting: Internal Medicine

## 2020-03-22 ENCOUNTER — Other Ambulatory Visit: Payer: Self-pay

## 2020-03-22 ENCOUNTER — Ambulatory Visit (INDEPENDENT_AMBULATORY_CARE_PROVIDER_SITE_OTHER): Payer: BC Managed Care – PPO | Admitting: Internal Medicine

## 2020-03-22 VITALS — BP 140/100 | HR 93 | Temp 98.4°F | Ht 72.0 in | Wt 184.1 lb

## 2020-03-22 DIAGNOSIS — E785 Hyperlipidemia, unspecified: Secondary | ICD-10-CM

## 2020-03-22 DIAGNOSIS — Z23 Encounter for immunization: Secondary | ICD-10-CM | POA: Diagnosis not present

## 2020-03-22 DIAGNOSIS — E1122 Type 2 diabetes mellitus with diabetic chronic kidney disease: Secondary | ICD-10-CM

## 2020-03-22 DIAGNOSIS — Z1211 Encounter for screening for malignant neoplasm of colon: Secondary | ICD-10-CM

## 2020-03-22 DIAGNOSIS — Z0000 Encounter for general adult medical examination without abnormal findings: Secondary | ICD-10-CM | POA: Diagnosis not present

## 2020-03-22 DIAGNOSIS — I10 Essential (primary) hypertension: Secondary | ICD-10-CM

## 2020-03-22 MED ORDER — LOSARTAN POTASSIUM 100 MG PO TABS: 100.0000 mg | ORAL_TABLET | Freq: Every day | ORAL | 1 refills | Status: DC

## 2020-03-22 NOTE — Addendum Note (Signed)
Addended by: Westley Hummer B on: 03/22/2020 04:11 PM   Modules accepted: Orders

## 2020-03-22 NOTE — Progress Notes (Signed)
Established Patient Office Visit     This visit occurred during the SARS-CoV-2 public health emergency.  Safety protocols were in place, including screening questions prior to the visit, additional usage of staff PPE, and extensive cleaning of exam room while observing appropriate contact time as indicated for disinfecting solutions.    CC/Reason for Visit: Annual preventive exam  HPI: Bryan Glenn is a 49 y.o. male who is coming in today for the above mentioned reasons. Past Medical History is significant for: Uncontrolled diabetes and hypertension, history of hyperlipidemia.  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  He has no complaints today.  At last visit his Trulicity dose was increased to 1.5 and he is tolerating this well.  He is also tolerating addition of metoprolol.  No complaints today.  He has routine eye and dental care, he is due for Tdap and Pneumovax.  He is overdue for screening colonoscopy.   Past Medical/Surgical History: Past Medical History:  Diagnosis Date  . Ankle fracture    left ankle fibula fracture  . Chronic kidney disease    donated a kidney  . Diabetes mellitus without complication ([place])   . Hyperlipidemia   . Hypertension     Past Surgical History:  Procedure Laterality Date  . NEPHRECTOMY     donated a kidey  . ORIF ANKLE FRACTURE Left 02/18/2018   Procedure: OPEN REDUCTION INTERNAL FIXATION (ORIF) LEFT FIBULA;  Surgeon: Newt Minion, MD;  Location: [facility];  Service: Orthopedics;  Laterality: Left;    Social History:  reports that he has been smoking cigarettes. He has never used smokeless tobacco. He reports current alcohol use. He reports that he does not use drugs.  Allergies: No Known Allergies  Family History:  Family History  Problem Relation Age of Onset  . Kidney disease Mother   . Kidney disease Brother   . Diabetes Father      Current Outpatient Medications:  .   amLODipine (NORVASC) 10 MG tablet, Take 1 tablet (10 mg total) by mouth daily., Disp: 30 tablet, Rfl: 2 .  blood glucose meter kit and supplies KIT, Dispense based on patient and insurance preference. Use up to four times daily as directed. (FOR ICD-10 E11.65), Disp: 1 each, Rfl: 0 .  Blood Glucose Monitoring Suppl (ACCU-CHEK AVIVA PLUS) w/Device KIT, Use 4 x daily as directed. E11.65, Disp: 1 kit, Rfl: 0 .  Dulaglutide (TRULICITY) 1.5 OH/6.0VP SOPN, Inject 1.5 mg into the skin once a week., Disp: 4 pen, Rfl: 3 .  glucose blood (ACCU-CHEK AVIVA) test strip, Use as instructed 4 x daily. E11.65, Disp: 150 each, Rfl: 5 .  ibuprofen (ADVIL,MOTRIN) 200 MG tablet, Take 400 mg by mouth every 4 (four) hours as needed for moderate pain., Disp: , Rfl:  .  Insulin Pen Needle (PEN NEEDLES 5/16") 30G X 8 MM MISC, 1 each by Does not apply route 2 (two) times daily., Disp: 100 each, Rfl: 5 .  Lancets (ACCU-CHEK SOFT TOUCH) lancets, Use as instructed 4 x daily, Disp: 150 each, Rfl: 2 .  losartan (COZAAR) 100 MG tablet, Take 1 tablet (100 mg total) by mouth daily., Disp: 90 tablet, Rfl: 1 .  metoprolol tartrate (LOPRESSOR) 25 MG tablet, Take 1 tablet (25 mg total) by mouth 2 (two) times daily., Disp: 180 tablet, Rfl: 1 .  pravastatin (PRAVACHOL) 40 MG tablet, Take 1 tablet (40 mg total) by mouth daily., Disp: 30  tablet, Rfl: 2  Review of Systems:  Constitutional: Denies fever, chills, diaphoresis, appetite change and fatigue.  HEENT: Denies photophobia, eye pain, redness, hearing loss, ear pain, congestion, sore throat, rhinorrhea, sneezing, mouth sores, trouble swallowing, neck pain, neck stiffness and tinnitus.   Respiratory: Denies SOB, DOE, cough, chest tightness,  and wheezing.   Cardiovascular: Denies chest pain, palpitations and leg swelling.  Gastrointestinal: Denies nausea, vomiting, abdominal pain, diarrhea, constipation, blood in stool and abdominal distention.  Genitourinary: Denies dysuria, urgency,  frequency, hematuria, flank pain and difficulty urinating.  Endocrine: Denies: hot or cold intolerance, sweats, changes in hair or nails, polyuria, polydipsia. Musculoskeletal: Denies myalgias, back pain, joint swelling, arthralgias and gait problem.  Skin: Denies pallor, rash and wound.  Neurological: Denies dizziness, seizures, syncope, weakness, light-headedness, numbness and headaches.  Hematological: Denies adenopathy. Easy bruising, personal or family bleeding history  Psychiatric/Behavioral: Denies suicidal ideation, mood changes, confusion, nervousness, sleep disturbance and agitation    Physical Exam: Vitals:   03/22/20 0954  BP: (!) 140/100  Pulse: 93  Temp: 98.4 F (36.9 C)  TempSrc: Oral  SpO2: 97%  Weight: 184 lb 1.6 oz (83.5 kg)  Height: 6' (1.829 m)    Body mass index is 24.97 kg/m.   Constitutional: NAD, calm, comfortable Eyes: PERRL, lids and conjunctivae normal, wears corrective lenses ENMT: Mucous membranes are moist. Tympanic membrane is pearly white, no erythema or bulging. Neck: normal, supple, no masses, no thyromegaly Respiratory: clear to auscultation bilaterally, no wheezing, no crackles. Normal respiratory effort. No accessory muscle use.  Cardiovascular: Regular rate and rhythm, no murmurs / rubs / gallops. No extremity edema. 2+ pedal pulses.  Abdomen: no tenderness, no masses palpated. No hepatosplenomegaly. Bowel sounds positive.  Musculoskeletal: no clubbing / cyanosis. No joint deformity upper and lower extremities. Good ROM, no contractures. Normal muscle tone.  Skin: no rashes, lesions, ulcers. No induration Neurologic: CN 2-12 grossly intact. Sensation intact, DTR normal. Strength 5/5 in all 4.  Psychiatric: Normal judgment and insight. Alert and oriented x 3. Normal mood.    Impression and Plan:  Encounter for preventive health examination -He has routine eye and dental care. -To receive Tdap and Pneumovax today, otherwise immunizations  are up-to-date. -Screening labs today. -Healthy lifestyle discussed in detail. -Referral to GI for screening colonoscopy. -PSA today.  Hyperlipidemia, unspecified hyperlipidemia type  - Plan: Lipid panel -Since he is a diabetic, goal LDL is less than 70.  Essential hypertension, benign  -Uncontrolled in office today with a blood pressure of 140/100. -Continue amlodipine 10 mg and metoprolol 25 mg twice daily, increase losartan from 50 to 100 mg. -Return in 6 weeks for follow-up.  Type 2 diabetes mellitus with chronic kidney disease, without long-term current use of insulin, unspecified CKD stage ([place]) -Uncontrolled at last visit with an H9Q of 22.2. -Trulicity dose was increased from 0.75-1.5. -Check A1c today, suspect he will need medication dose adjustment again.    Patient Instructions  -Nice seeing you today!!  -Lab work today; will notify you once results are available.  -Pneumonia and tetanus vaccines today.  -Referral for colonoscopy has been requested today.  -Increase losartan from 50 to 100 mg daily.  -Schedule follow up in 6 weeks for your blood pressure.   Preventive Care 73-71 Years Old, Male Preventive care refers to lifestyle choices and visits with your health care provider that can promote health and wellness. This includes:  A yearly physical exam. This is also called an annual well check.  Regular dental and  eye exams.  Immunizations.  Screening for certain conditions.  Healthy lifestyle choices, such as eating a healthy diet, getting regular exercise, not using drugs or products that contain nicotine and tobacco, and limiting alcohol use. What can I expect for my preventive care visit? Physical exam Your health care provider will check:  Height and weight. These may be used to calculate body mass index (BMI), which is a measurement that tells if you are at a healthy weight.  Heart rate and blood pressure.  Your skin for abnormal  spots. Counseling Your health care provider may ask you questions about:  Alcohol, tobacco, and drug use.  Emotional well-being.  Home and relationship well-being.  Sexual activity.  Eating habits.  Work and work Statistician. What immunizations do I need?  Influenza (flu) vaccine  This is recommended every year. Tetanus, diphtheria, and pertussis (Tdap) vaccine  You may need a Td booster every 10 years. Varicella (chickenpox) vaccine  You may need this vaccine if you have not already been vaccinated. Zoster (shingles) vaccine  You may need this after age 7. Measles, mumps, and rubella (MMR) vaccine  You may need at least one dose of MMR if you were born in 1957 or later. You may also need a second dose. Pneumococcal conjugate (PCV13) vaccine  You may need this if you have certain conditions and were not previously vaccinated. Pneumococcal polysaccharide (PPSV23) vaccine  You may need one or two doses if you smoke cigarettes or if you have certain conditions. Meningococcal conjugate (MenACWY) vaccine  You may need this if you have certain conditions. Hepatitis A vaccine  You may need this if you have certain conditions or if you travel or work in places where you may be exposed to hepatitis A. Hepatitis B vaccine  You may need this if you have certain conditions or if you travel or work in places where you may be exposed to hepatitis B. Haemophilus influenzae type b (Hib) vaccine  You may need this if you have certain risk factors. Human papillomavirus (HPV) vaccine  If recommended by your health care provider, you may need three doses over 6 months. You may receive vaccines as individual doses or as more than one vaccine together in one shot (combination vaccines). Talk with your health care provider about the risks and benefits of combination vaccines. What tests do I need? Blood tests  Lipid and cholesterol levels. These may be checked every 5 years, or  more frequently if you are over 9 years old.  Hepatitis C test.  Hepatitis B test. Screening  Lung cancer screening. You may have this screening every year starting at age 16 if you have a 30-pack-year history of smoking and currently smoke or have quit within the past 15 years.  Prostate cancer screening. Recommendations will vary depending on your family history and other risks.  Colorectal cancer screening. All adults should have this screening starting at age 43 and continuing until age 58. Your health care provider may recommend screening at age 1 if you are at increased risk. You will have tests every 1-10 years, depending on your results and the type of screening test.  Diabetes screening. This is done by checking your blood sugar (glucose) after you have not eaten for a while (fasting). You may have this done every 1-3 years.  Sexually transmitted disease (STD) testing. Follow these instructions at home: Eating and drinking  Eat a diet that includes fresh fruits and vegetables, whole grains, lean protein, and low-fat dairy  products.  Take vitamin and mineral supplements as recommended by your health care provider.  Do not drink alcohol if your health care provider tells you not to drink.  If you drink alcohol: ? Limit how much you have to 0-2 drinks a day. ? Be aware of how much alcohol is in your drink. In the [place] one drink equals one 12 oz bottle of beer (355 mL), one 5 oz glass of wine (148 mL), or one 1 oz glass of hard liquor (44 mL). Lifestyle  Take daily care of your teeth and gums.  Stay active. Exercise for at least 30 minutes on 5 or more days each week.  Do not use any products that contain nicotine or tobacco, such as cigarettes, e-cigarettes, and chewing tobacco. If you need help quitting, ask your health care provider.  If you are sexually active, practice safe sex. Use a condom or other form of protection to prevent STIs (sexually transmitted  infections).  Talk with your health care provider about taking a low-dose aspirin every day starting at age 61. What's next?  Go to your health care provider once a year for a well check visit.  Ask your health care provider how often you should have your eyes and teeth checked.  Stay up to date on all vaccines. This information is not intended to replace advice given to you by your health care provider. Make sure you discuss any questions you have with your health care provider. Document Revised: 08/13/2018 Document Reviewed: 08/13/2018 Elsevier Patient Education  2020 [place], [place] [facility]

## 2020-03-22 NOTE — Addendum Note (Signed)
Addended by: Westley Hummer B on: 03/22/2020 10:37 AM   Modules accepted: Orders

## 2020-03-22 NOTE — Patient Instructions (Signed)
-Nice seeing you today!!  -Lab work today; will notify you once results are available.  -Pneumonia and tetanus vaccines today.  -Referral for colonoscopy has been requested today.  -Increase losartan from 50 to 100 mg daily.  -Schedule follow up in 6 weeks for your blood pressure.   Preventive Care 40-48 Years Old, Male Preventive care refers to lifestyle choices and visits with your health care provider that can promote health and wellness. This includes:  A yearly physical exam. This is also called an annual well check.  Regular dental and eye exams.  Immunizations.  Screening for certain conditions.  Healthy lifestyle choices, such as eating a healthy diet, getting regular exercise, not using drugs or products that contain nicotine and tobacco, and limiting alcohol use. What can I expect for my preventive care visit? Physical exam Your health care provider will check:  Height and weight. These may be used to calculate body mass index (BMI), which is a measurement that tells if you are at a healthy weight.  Heart rate and blood pressure.  Your skin for abnormal spots. Counseling Your health care provider may ask you questions about:  Alcohol, tobacco, and drug use.  Emotional well-being.  Home and relationship well-being.  Sexual activity.  Eating habits.  Work and work environment. What immunizations do I need?  Influenza (flu) vaccine  This is recommended every year. Tetanus, diphtheria, and pertussis (Tdap) vaccine  You may need a Td booster every 10 years. Varicella (chickenpox) vaccine  You may need this vaccine if you have not already been vaccinated. Zoster (shingles) vaccine  You may need this after age 60. Measles, mumps, and rubella (MMR) vaccine  You may need at least one dose of MMR if you were born in 1957 or later. You may also need a second dose. Pneumococcal conjugate (PCV13) vaccine  You may need this if you have certain conditions  and were not previously vaccinated. Pneumococcal polysaccharide (PPSV23) vaccine  You may need one or two doses if you smoke cigarettes or if you have certain conditions. Meningococcal conjugate (MenACWY) vaccine  You may need this if you have certain conditions. Hepatitis A vaccine  You may need this if you have certain conditions or if you travel or work in places where you may be exposed to hepatitis A. Hepatitis B vaccine  You may need this if you have certain conditions or if you travel or work in places where you may be exposed to hepatitis B. Haemophilus influenzae type b (Hib) vaccine  You may need this if you have certain risk factors. Human papillomavirus (HPV) vaccine  If recommended by your health care provider, you may need three doses over 6 months. You may receive vaccines as individual doses or as more than one vaccine together in one shot (combination vaccines). Talk with your health care provider about the risks and benefits of combination vaccines. What tests do I need? Blood tests  Lipid and cholesterol levels. These may be checked every 5 years, or more frequently if you are over 50 years old.  Hepatitis C test.  Hepatitis B test. Screening  Lung cancer screening. You may have this screening every year starting at age 55 if you have a 30-pack-year history of smoking and currently smoke or have quit within the past 15 years.  Prostate cancer screening. Recommendations will vary depending on your family history and other risks.  Colorectal cancer screening. All adults should have this screening starting at age 50 and continuing until age   47. Your health care provider may recommend screening at age 72 if you are at increased risk. You will have tests every 1-10 years, depending on your results and the type of screening test.  Diabetes screening. This is done by checking your blood sugar (glucose) after you have not eaten for a while (fasting). You may have this  done every 1-3 years.  Sexually transmitted disease (STD) testing. Follow these instructions at home: Eating and drinking  Eat a diet that includes fresh fruits and vegetables, whole grains, lean protein, and low-fat dairy products.  Take vitamin and mineral supplements as recommended by your health care provider.  Do not drink alcohol if your health care provider tells you not to drink.  If you drink alcohol: ? Limit how much you have to 0-2 drinks a day. ? Be aware of how much alcohol is in your drink. In the [place] one drink equals one 12 oz bottle of beer (355 mL), one 5 oz glass of wine (148 mL), or one 1 oz glass of hard liquor (44 mL). Lifestyle  Take daily care of your teeth and gums.  Stay active. Exercise for at least 30 minutes on 5 or more days each week.  Do not use any products that contain nicotine or tobacco, such as cigarettes, e-cigarettes, and chewing tobacco. If you need help quitting, ask your health care provider.  If you are sexually active, practice safe sex. Use a condom or other form of protection to prevent STIs (sexually transmitted infections).  Talk with your health care provider about taking a low-dose aspirin every day starting at age 29. What's next?  Go to your health care provider once a year for a well check visit.  Ask your health care provider how often you should have your eyes and teeth checked.  Stay up to date on all vaccines. This information is not intended to replace advice given to you by your health care provider. Make sure you discuss any questions you have with your health care provider. Document Revised: 08/13/2018 Document Reviewed: 08/13/2018 Elsevier Patient Education  2020 [employer].

## 2020-03-23 ENCOUNTER — Encounter: Payer: Self-pay | Admitting: Internal Medicine

## 2020-03-23 ENCOUNTER — Other Ambulatory Visit: Payer: Self-pay | Admitting: Internal Medicine

## 2020-03-23 DIAGNOSIS — E785 Hyperlipidemia, unspecified: Secondary | ICD-10-CM

## 2020-03-23 DIAGNOSIS — E559 Vitamin D deficiency, unspecified: Secondary | ICD-10-CM | POA: Insufficient documentation

## 2020-03-23 DIAGNOSIS — E1122 Type 2 diabetes mellitus with diabetic chronic kidney disease: Secondary | ICD-10-CM

## 2020-03-23 LAB — CBC WITH DIFFERENTIAL/PLATELET
Absolute Monocytes: 576 cells/uL (ref 200–950)
Basophils Absolute: 30 cells/uL (ref 0–200)
Basophils Relative: 0.5 %
Eosinophils Absolute: 42 cells/uL (ref 15–500)
Eosinophils Relative: 0.7 %
HCT: 47.3 % (ref 38.5–50.0)
Hemoglobin: 16.1 g/dL (ref 13.2–17.1)
Lymphs Abs: 1542 cells/uL (ref 850–3900)
MCH: 33.7 pg — ABNORMAL HIGH (ref 27.0–33.0)
MCHC: 34 g/dL (ref 32.0–36.0)
MCV: 99 fL (ref 80.0–100.0)
MPV: 11.1 fL (ref 7.5–12.5)
Monocytes Relative: 9.6 %
Neutro Abs: 3810 cells/uL (ref 1500–7800)
Neutrophils Relative %: 63.5 %
Platelets: 163 10*3/uL (ref 140–400)
RBC: 4.78 10*6/uL (ref 4.20–5.80)
RDW: 13.1 % (ref 11.0–15.0)
Total Lymphocyte: 25.7 %
WBC: 6 10*3/uL (ref 3.8–10.8)

## 2020-03-23 LAB — COMPREHENSIVE METABOLIC PANEL
AG Ratio: 1.6 (calc) (ref 1.0–2.5)
ALT: 25 U/L (ref 9–46)
AST: 24 U/L (ref 10–40)
Albumin: 4.6 g/dL (ref 3.6–5.1)
Alkaline phosphatase (APISO): 77 U/L (ref 36–130)
BUN: 14 mg/dL (ref 7–25)
CO2: 29 mmol/L (ref 20–32)
Calcium: 9.8 mg/dL (ref 8.6–10.3)
Chloride: 100 mmol/L (ref 98–110)
Creat: 1.29 mg/dL (ref 0.60–1.35)
Globulin: 2.9 g/dL (calc) (ref 1.9–3.7)
Glucose, Bld: 237 mg/dL — ABNORMAL HIGH (ref 65–99)
Potassium: 3.9 mmol/L (ref 3.5–5.3)
Sodium: 136 mmol/L (ref 135–146)
Total Bilirubin: 0.7 mg/dL (ref 0.2–1.2)
Total Protein: 7.5 g/dL (ref 6.1–8.1)

## 2020-03-23 LAB — HEMOGLOBIN A1C
Hgb A1c MFr Bld: 10.3 % of total Hgb — ABNORMAL HIGH (ref <5.7)
Mean Plasma Glucose: 249 (calc)
eAG (mmol/L): 13.8 (calc)

## 2020-03-23 LAB — LIPID PANEL
Cholesterol: 178 mg/dL (ref <200)
HDL: 52 mg/dL (ref >=40)
LDL Cholesterol (Calc): 104 mg/dL (calc) — ABNORMAL HIGH
Non-HDL Cholesterol (Calc): 126 mg/dL (calc) (ref <130)
Total CHOL/HDL Ratio: 3.4 (calc) (ref <5.0)
Triglycerides: 121 mg/dL (ref <150)

## 2020-03-23 LAB — PSA: PSA: 1.7 ng/mL (ref <=4.0)

## 2020-03-23 LAB — VITAMIN D 25 HYDROXY (VIT D DEFICIENCY, FRACTURES): Vit D, 25-Hydroxy: 12 ng/mL — ABNORMAL LOW (ref 30–100)

## 2020-03-23 LAB — TSH: TSH: 2.9 mIU/L (ref 0.40–4.50)

## 2020-03-23 LAB — VITAMIN B12: Vitamin B-12: 486 pg/mL (ref 200–1100)

## 2020-03-23 MED ORDER — ATORVASTATIN CALCIUM 80 MG PO TABS: 80.0000 mg | ORAL_TABLET | Freq: Every day | ORAL | 1 refills | Status: DC

## 2020-03-23 MED ORDER — VITAMIN D (ERGOCALCIFEROL) 1.25 MG (50000 UNIT) PO CAPS: 50000.0000 [IU] | ORAL_CAPSULE | ORAL | 0 refills | Status: AC

## 2020-03-23 MED ORDER — TRULICITY 3 MG/0.5ML ~~LOC~~ SOAJ: 3.0000 mg | SUBCUTANEOUS | 3 refills | Status: DC

## 2020-04-25 ENCOUNTER — Encounter: Payer: Self-pay | Admitting: Gastroenterology

## 2020-05-03 ENCOUNTER — Ambulatory Visit: Payer: BC Managed Care – PPO | Admitting: Internal Medicine

## 2020-05-09 ENCOUNTER — Ambulatory Visit: Payer: BC Managed Care – PPO | Admitting: Internal Medicine

## 2020-06-05 ENCOUNTER — Ambulatory Visit: Payer: BC Managed Care – PPO | Admitting: Internal Medicine

## 2020-06-19 ENCOUNTER — Encounter: Payer: Self-pay | Admitting: Internal Medicine

## 2020-06-24 ENCOUNTER — Other Ambulatory Visit: Payer: Self-pay | Admitting: Internal Medicine

## 2020-06-24 DIAGNOSIS — I10 Essential (primary) hypertension: Secondary | ICD-10-CM

## 2020-06-29 ENCOUNTER — Encounter: Payer: BC Managed Care – PPO | Admitting: Gastroenterology

## 2020-07-10 ENCOUNTER — Ambulatory Visit: Payer: BC Managed Care – PPO

## 2020-07-24 ENCOUNTER — Telehealth: Payer: Self-pay | Admitting: Internal Medicine

## 2020-07-24 ENCOUNTER — Ambulatory Visit: Payer: BC Managed Care – PPO | Admitting: Internal Medicine

## 2020-07-24 NOTE — Progress Notes (Deleted)
Name: Bryan Glenn  MRN/ DOB: [id number], 1971-02-26   Age/ Sex: 49 y.o., male    PCP: Isaac Bliss, Rayford Halsted, MD   Reason for Endocrinology Evaluation: Type 2 Diabetes Mellitus     Date of Initial Endocrinology Visit: 07/24/2020     PATIENT IDENTIFIER: Mr. Bryan Glenn is a 49 y.o. male with a past medical history of T2DM, HTN and Dyslipidemia . The patient presented for initial endocrinology clinic visit on 07/24/2020 for consultative assistance with his diabetes management.    HPI: Mr. Bryan Glenn was    Diagnosed with DM in 2016 Prior Medications tried/Intolerance: *** Currently checking blood sugars *** x / day,  before breakfast and ***.  Hypoglycemia episodes : ***               Symptoms: ***                 Frequency: ***/  Hemoglobin A1c has ranged from 7.1% in 2019, peaking at 10.3% in 2021. Patient required assistance for hypoglycemia:  Patient has required hospitalization within the last 1 year from hyper or hypoglycemia:   In terms of diet, the patient ***   HOME DIABETES REGIMEN: Trulicity 3 mg weekly    Statin: Yes ACE-I/ARB: yes Prior Diabetic Education: {Yes/No:11203}   METER DOWNLOAD SUMMARY: Date range evaluated: *** Fingerstick Blood Glucose Tests = *** Average Number Tests/Day = *** Overall Mean FS Glucose = *** Standard Deviation = ***  BG Ranges: Low = *** High = ***   Hypoglycemic Events/30 Days: BG < 50 = *** Episodes of symptomatic severe hypoglycemia = ***   DIABETIC COMPLICATIONS: Microvascular complications:   ***  Denies: ***  Last eye exam: Completed   Macrovascular complications:   ***  Denies: CAD, PVD, CVA   PAST HISTORY: Past Medical History:  Past Medical History:  Diagnosis Date  . Ankle fracture    left ankle fibula fracture  . Chronic kidney disease    donated a kidney  . Diabetes mellitus without complication ([place])   . Hyperlipidemia   . Hypertension     Past Surgical History:   Past Surgical History:  Procedure Laterality Date  . NEPHRECTOMY     donated a kidey  . ORIF ANKLE FRACTURE Left 02/18/2018   Procedure: OPEN REDUCTION INTERNAL FIXATION (ORIF) LEFT FIBULA;  Surgeon: Newt Minion, MD;  Location: [facility];  Service: Orthopedics;  Laterality: Left;      Social History:  reports that he has been smoking cigarettes. He has never used smokeless tobacco. He reports current alcohol use. He reports that he does not use drugs. Family History:  Family History  Problem Relation Age of Onset  . Kidney disease Mother   . Kidney disease Brother   . Diabetes Father       HOME MEDICATIONS: Allergies as of 07/24/2020   No Known Allergies     Medication List       Accurate as of July 24, 2020  7:07 AM. If you have any questions, ask your nurse or doctor.        Accu-Chek Aviva Plus w/Device Kit Use 4 x daily as directed. E11.65   accu-chek soft touch lancets Use as instructed 4 x daily   amLODipine 10 MG tablet Commonly known as: NORVASC Take 1 tablet (10 mg total) by mouth daily.   atorvastatin 80 MG tablet Commonly known as: LIPITOR Take 1 tablet (80 mg total) by mouth daily.   blood glucose meter kit  and supplies Kit Dispense based on patient and insurance preference. Use up to four times daily as directed. (FOR ICD-10 E11.65)   glucose blood test strip Commonly known as: Accu-Chek Aviva Use as instructed 4 x daily. E11.65   ibuprofen 200 MG tablet Commonly known as: ADVIL Take 400 mg by mouth every 4 (four) hours as needed for moderate pain.   losartan 100 MG tablet Commonly known as: COZAAR Take 1 tablet (100 mg total) by mouth daily.   metoprolol tartrate 25 MG tablet Commonly known as: LOPRESSOR Take 1 tablet (25 mg total) by mouth 2 (two) times daily.   Pen Needles 5/16" 30G X 8 MM Misc 1 each by Does not apply route 2 (two) times daily.   Trulicity 3 XY/8.0XK Sopn Generic drug: Dulaglutide Inject 0.5 mLs (3 mg total)  as directed once a week.        ALLERGIES: No Known Allergies   REVIEW OF SYSTEMS: A comprehensive ROS was conducted with the patient and is negative except as per HPI and below:  ROS    OBJECTIVE:   VITAL SIGNS: There were no vitals taken for this visit.   PHYSICAL EXAM:  General: Pt appears well and is in NAD  Hydration: Well-hydrated with moist mucous membranes and good skin turgor  HEENT: Head: Unremarkable with good dentition. Oropharynx clear without exudate.  Eyes: External eye exam normal without stare, lid lag or exophthalmos.  EOM intact.  PERRL.  Neck: General: Supple without adenopathy or carotid bruits. Thyroid: Thyroid size normal.  No goiter or nodules appreciated. No thyroid bruit.  Lungs: Clear with good BS bilat with no rales, rhonchi, or wheezes  Heart: RRR with normal S1 and S2 and no gallops; no murmurs; no rub  Abdomen: Normoactive bowel sounds, soft, nontender, without masses or organomegaly palpable  Extremities:  Lower extremities - No pretibial edema. No lesions.  Skin: Normal texture and temperature to palpation. No rash noted. No Acanthosis nigricans/skin tags. No lipohypertrophy.  Neuro: MS is good with appropriate affect, pt is alert and Ox3    DM foot exam:    DATA REVIEWED:  Lab Results  Component Value Date   HGBA1C 10.3 (H) 03/22/2020   HGBA1C 10.4 (A) 12/21/2019   HGBA1C 7.1 (H) 02/18/2018   Lab Results  Component Value Date   LDLCALC 104 (H) 03/22/2020   CREATININE 1.29 03/22/2020   No results found for: [facility]  Lab Results  Component Value Date   CHOL 178 03/22/2020   HDL 52 03/22/2020   LDLCALC 104 (H) 03/22/2020   TRIG 121 03/22/2020   CHOLHDL 3.4 03/22/2020        ASSESSMENT / PLAN / RECOMMENDATIONS:   1) Type 2 Diabetes Mellitus, ***controlled, With*** complications - Most recent A1c of *** %. Goal A1c < 7.0 %.    Plan: GENERAL:  ***  MEDICATIONS:  ***  EDUCATION / INSTRUCTIONS:  BG monitoring  instructions: Patient is instructed to check his blood sugars *** times a day, ***.  Call [facility] if: BG persistently < 70 or > 300. . I reviewed the Rule of 15 for the treatment of hypoglycemia in detail with the patient. Literature supplied.   2) Diabetic complications:   Eye: Does *** have known diabetic retinopathy.   Neuro/ Feet: Does *** have known diabetic peripheral neuropathy.  Renal: Patient does *** have known baseline CKD. He is *** on an ACEI/ARB at present.Check urine albumin/creatinine ratio yearly starting at time of diagnosis. If albuminuria  is positive, treatment is geared toward better glucose, blood pressure control and use of ACE inhibitors or ARBs. Monitor electrolytes and creatinine once to twice yearly.   3) Dyslipidemia: LDL above goal at 104 mg/dL.         Signed electronically by: Mack Guise, MD  [facility]  [facility] [address] [place], [place] [address] Phone: [phone] FAX: [phone]   CC: Isaac Bliss, Rayford Halsted, MD [place] [address] Phone: [phone]  Fax: [phone]    Return to Endocrinology clinic as below: Future Appointments  Date Time Provider [place]  07/24/2020  7:30 AM Melodee Lupe, Melanie Crazier, MD [id number] None

## 2020-07-24 NOTE — Telephone Encounter (Signed)
Patient rescheduled 1st appointment and was too late for his scheduled appointment to be seen and he declined the referral to another doctor  Just FYI

## 2020-12-28 LAB — COLOGUARD: COLOGUARD: NEGATIVE

## 2021-10-06 ENCOUNTER — Emergency Department (HOSPITAL_COMMUNITY): Payer: BC Managed Care – PPO

## 2021-10-06 ENCOUNTER — Emergency Department (HOSPITAL_COMMUNITY)
Admission: EM | Admit: 2021-10-06 | Discharge: 2021-10-06 | Disposition: A | Discharge status: Home / Self Care | Payer: BC Managed Care – PPO | Attending: Emergency Medicine | Admitting: Emergency Medicine

## 2021-10-06 ENCOUNTER — Other Ambulatory Visit: Payer: Self-pay

## 2021-10-06 ENCOUNTER — Encounter (HOSPITAL_COMMUNITY): Payer: Self-pay

## 2021-10-06 DIAGNOSIS — Z794 Long term (current) use of insulin: Secondary | ICD-10-CM | POA: Diagnosis not present

## 2021-10-06 DIAGNOSIS — R030 Elevated blood-pressure reading, without diagnosis of hypertension: Secondary | ICD-10-CM | POA: Insufficient documentation

## 2021-10-06 DIAGNOSIS — I10 Essential (primary) hypertension: Secondary | ICD-10-CM | POA: Insufficient documentation

## 2021-10-06 DIAGNOSIS — Z79899 Other long term (current) drug therapy: Secondary | ICD-10-CM | POA: Diagnosis not present

## 2021-10-06 DIAGNOSIS — Z7984 Long term (current) use of oral hypoglycemic drugs: Secondary | ICD-10-CM | POA: Diagnosis not present

## 2021-10-06 DIAGNOSIS — E119 Type 2 diabetes mellitus without complications: Secondary | ICD-10-CM | POA: Diagnosis not present

## 2021-10-06 LAB — CBC
HCT: 43.9 % (ref 39.0–52.0)
Hemoglobin: 15.4 g/dL (ref 13.0–17.0)
MCH: 33.9 pg (ref 26.0–34.0)
MCHC: 35.1 g/dL (ref 30.0–36.0)
MCV: 96.7 fL (ref 80.0–100.0)
Platelets: 190 10*3/uL (ref 150–400)
RBC: 4.54 MIL/uL (ref 4.22–5.81)
RDW: 12.6 % (ref 11.5–15.5)
WBC: 7.3 10*3/uL (ref 4.0–10.5)
nRBC: 0 % (ref 0.0–0.2)

## 2021-10-06 LAB — BASIC METABOLIC PANEL
Anion gap: 9 (ref 5–15)
BUN: 11 mg/dL (ref 6–20)
CO2: 26 mmol/L (ref 22–32)
Calcium: 9.4 mg/dL (ref 8.9–10.3)
Chloride: 101 mmol/L (ref 98–111)
Creatinine, Ser: 1.4 mg/dL — ABNORMAL HIGH (ref 0.61–1.24)
GFR, Estimated: >60 mL/min (ref >60)
Glucose, Bld: 143 mg/dL — ABNORMAL HIGH (ref 70–99)
Potassium: 4.6 mmol/L (ref 3.5–5.1)
Sodium: 136 mmol/L (ref 135–145)

## 2021-10-06 LAB — TROPONIN I (HIGH SENSITIVITY): Troponin I (High Sensitivity): 7 ng/L (ref <18)

## 2021-10-06 MED ORDER — HYDRALAZINE HCL 20 MG/ML IJ SOLN
10.0000 mg | Freq: Once | INTRAMUSCULAR | Status: AC
  Administered 2021-10-06: 10 mg via INTRAVENOUS
  Filled 2021-10-06: qty 1

## 2021-10-06 NOTE — ED Provider Notes (Addendum)
[facility] EMERGENCY DEPARTMENT Provider Note   CSN: [id number] Arrival date & time: 10/06/21  1004     History  Chief Complaint  Patient presents with   Hypertension    Bryan Glenn is a 51 y.o. male history includes hypertension, diabetes, hyperlipidemia.  Patient reports that he went to his primary care provider for a routine visit today.  Patient reports that he is feeling well and has no complaints.  Patient reports that while at the primary care provider's office they checked his blood pressure and told him it was elevated and that he needed to come to the emergency department for further evaluation.  Patient reports that he is taking his medications as prescribed, he takes metoprolol 25 mg and olmesartan 40 mg daily.  Patient reports that he was recently taken off of his amlodipine by his primary care provider but he is not sure why.  Patient denies chest pain, shortness of breath, headache, vision changes, nausea, vomiting, diarrhea, cough, numbness, tingling, weakness, dizziness or any additional concerns.  HPI     Home Medications Prior to Admission medications   Medication Sig Start Date End Date Taking? Authorizing Provider  atorvastatin (LIPITOR) 80 MG tablet Take 1 tablet (80 mg total) by mouth daily. 03/23/20  Yes Isaac Bliss, Rayford Halsted, MD  glimepiride (AMARYL) 4 MG tablet Take 4 mg by mouth daily with breakfast.   Yes [provider]  metoprolol tartrate (LOPRESSOR) 25 MG tablet Take 1 tablet (25 mg total) by mouth 2 (two) times daily. 06/24/20  Yes Isaac Bliss, Rayford Halsted, MD  olmesartan (BENICAR) 40 MG tablet Take 40 mg by mouth daily.   Yes [provider]  amLODipine (NORVASC) 10 MG tablet Take 1 tablet (10 mg total) by mouth daily. 09/03/16   Cassandria Anger, MD  blood glucose meter kit and supplies KIT Dispense based on patient and insurance preference. Use up to four times daily as directed. (FOR ICD-10  E11.65) 10/09/15   Cassandria Anger, MD  Blood Glucose Monitoring Suppl (ACCU-CHEK AVIVA PLUS) w/Device KIT Use 4 x daily as directed. E11.65 10/09/15   Cassandria Anger, MD  Dulaglutide (TRULICITY) 3 BW/6.2MB SOPN Inject 0.5 mLs (3 mg total) as directed once a week. 03/23/20   Isaac Bliss, Rayford Halsted, MD  glucose blood (ACCU-CHEK AVIVA) test strip Use as instructed 4 x daily. E11.65 10/09/15   Cassandria Anger, MD  ibuprofen (ADVIL,MOTRIN) 200 MG tablet Take 400 mg by mouth every 4 (four) hours as needed for moderate pain.    [provider]  Insulin Pen Needle (PEN NEEDLES 5/16") 30G X 8 MM MISC 1 each by Does not apply route 2 (two) times daily. 09/27/15   Cassandria Anger, MD  Lancets (ACCU-CHEK SOFT TOUCH) lancets Use as instructed 4 x daily 10/09/15   Cassandria Anger, MD  losartan (COZAAR) 100 MG tablet Take 1 tablet (100 mg total) by mouth daily. 03/22/20   Isaac Bliss, Rayford Halsted, MD      Allergies    Patient has no known allergies.    Review of Systems   Review of Systems Ten systems are reviewed and are negative for acute change except as noted in the HPI  Physical Exam Updated Vital Signs BP (!) 163/144    Pulse 83    Temp 98 F (36.7 C)    Resp 16    Ht 6' (1.829 m)    Wt 94.3 kg    SpO2  97%    BMI 28.21 kg/m  Physical Exam Constitutional:      General: He is not in acute distress.    Appearance: Normal appearance. He is well-developed. He is not ill-appearing or diaphoretic.  HENT:     Head: Normocephalic and atraumatic.  Eyes:     General: Vision grossly intact. Gaze aligned appropriately.     Pupils: Pupils are equal, round, and reactive to light.  Neck:     Trachea: Trachea and phonation normal.  Pulmonary:     Effort: Pulmonary effort is normal. No respiratory distress.  Abdominal:     General: There is no distension.     Palpations: Abdomen is soft.     Tenderness: There is no abdominal tenderness. There is no guarding or rebound.   Musculoskeletal:        General: Normal range of motion.     Cervical back: Normal range of motion.  Skin:    General: Skin is warm and dry.  Neurological:     Mental Status: He is alert.     GCS: GCS eye subscore is 4. GCS verbal subscore is 5. GCS motor subscore is 6.     Comments: Speech is clear and goal oriented, follows commands Major Cranial nerves without deficit, no facial droop Normal strength in upper and lower extremities bilaterally including dorsiflexion and plantar flexion, strong and equal grip strength Sensation normal to light and sharp touch Moves extremities without ataxia, coordination intact Neg romberg, no pronator drift Normal gait  Psychiatric:        Behavior: Behavior normal.    ED Results / Procedures / Treatments   Labs (all labs ordered are listed, but only abnormal results are displayed) Labs Reviewed  BASIC METABOLIC PANEL - Abnormal; Notable for the following components:      Result Value   Glucose, Bld 143 (*)    Creatinine, Ser 1.40 (*)    All other components within normal limits  CBC  TROPONIN I (HIGH SENSITIVITY)    EKG EKG Interpretation  Date/Time:  Saturday October 06 2021 11:38:16 EST Ventricular Rate:  82 PR Interval:  180 QRS Duration: 153 QT Interval:  353 QTC Calculation: 413 R Axis:   64 Text Interpretation: Sinus rhythm Nonspecific intraventricular conduction delay Nonspecific ST segment changes in the precordial leads without reciprocal changes No STEMI Confirmed by Regan Lemming ([id number]) on 10/06/2021 11:48:23 AM  Radiology DG Chest Port 1 View  Result Date: 10/06/2021 CLINICAL DATA:  Hypertension, electrocardiographic changes EXAM: PORTABLE CHEST 1 VIEW COMPARISON:  None. FINDINGS: The heart size and mediastinal contours are within normal limits. Both lungs are clear. The visualized skeletal structures are unremarkable. IMPRESSION: No active disease. Electronically Signed   By: Elmer Picker M.D.   On: 10/06/2021  12:12    Procedures Procedures    Medications Ordered in ED Medications  hydrALAZINE (APRESOLINE) injection 10 mg (10 mg Intravenous Given 10/06/21 1239)    ED Course/ Medical Decision Making/ A&P Clinical Course as of 10/06/21 1344  Sat Oct 06, 2021  1028 Olmesartan 40 mg daily Metoprolol 25 mg daily [BM]  1341 DG Chest Port 1 View I have personally reviewed patient's chest x-ray and agree with radiologist interpretation that of no obvious acute cardiopulmonary processes. [BM]  1610 Basic metabolic panel(!) Patient's creatinine of 1.40 is near baseline, prior was 1.29 1-year ago.  No emergent electrolyte derangement, AKI or gap. [BM]  1342 Troponin I (High Sensitivity) Reassuring, patient is asymptomatic, no indication for delta  troponin. [BM]  1343 CBC No leukocytosis, anemia or thrombocytopenia.  Reassuring. [BM]  8921 BP(!): 167/109 Patient blood pressure improved following 10 mg hydralazine.  Patient remains asymptomatic [BM]    Clinical Course User Index [BM] Gari Crown                           Medical Decision Making 51 year old male history as above presented to the ER sent in by PCP for evaluation of hypertension.  Patient reports he had no complaints and this was a routine PCP visit for him this morning.  He denies any signs or symptoms suggestive of hypertensive urgency or emergency.  Patient overall well-appearing and in no acute distress, patient is noted to be hypertensive otherwise vital signs are within normal limits. ------ I performed a chart review on this patient through care everywhere, I am unable to review notes from patient's primary care appointment but there is a diagnosis of hypertensive disorder from today.  No other available information.  Blood pressure 187/118 and patient asymptomatic, discussed case with Dr. Armandina Gemma. ------ Patient's EKG showed ST changes in V2 alone compared to 2019, no changes in V3 and no reciprocal changes.  Does  not appear as acute MI at this time. Dr. Armandina Gemma discussed case with cardiology Dr. Claiborne Billings who agrees, not acute MI. ------   Amount and/or Complexity of Data Reviewed External Data Reviewed: notes.    Details: Care everywhere utilized, patient has a PCP encounter from today with Dr. Altha Harm, diagnosis hypertensive disorder, no other information was available  Care everywhere utilized, patient has a PCP encounter from today with Dr. Altha Harm, diagnosis hypertensive disorder, no other information was available Labs: ordered. Decision-making details documented in ED Course. Radiology: ordered. Decision-making details documented in ED Course.  Risk Prescription drug management.   On reassessment patient is well-appearing no acute distress he has no complaints or concerns he remains asymptomatic throughout his ER visit.  There is no indication for further testing or treatment at this time, no evidence for hypertensive urgency/emergency.  Diagnosis of asymptomatic hypertension at this point, patient needs further management by his primary care provider.  I asked the patient call his primary care provider today to inform them of work-up and to discuss further medication control of his blood pressure.  I discussed signs/symptoms of hypertensive urgency/emergency with patient in detail, and he stated understanding and knows to return to the ER/call 911 if symptoms occur.  At this time there does not appear to be any evidence of an acute emergency medical condition and the patient appears stable for discharge with appropriate outpatient follow up. Diagnosis was discussed with patient who verbalizes understanding of care plan and is agreeable to discharge. I have discussed return precautions with patient who verbalizes understanding. Patient encouraged to follow-up with their PCP. All questions answered.  Patient's case discussed with Dr. Armandina Gemma who agrees with plan to discharge with PCP follow-up.    Note: Portions of this report may have been transcribed using voice recognition software. Every effort was made to ensure accuracy; however, inadvertent computerized transcription errors may still be present.         Final Clinical Impression(s) / ED Diagnoses Final diagnoses:  Elevated blood pressure reading    Rx / DC Orders ED Discharge Orders     None         Deliah Boston, PA-C 10/06/21 1344    Deliah Boston, [place] 10/06/21 1416  Regan Lemming, MD 10/06/21 1701

## 2021-10-06 NOTE — ED Notes (Signed)
Light green and lavender top sent down to lab at this time.

## 2021-10-06 NOTE — Discharge Instructions (Signed)
At this time there does not appear to be the presence of an emergent medical condition, however there is always the potential for conditions to change. Please read and follow the below instructions.  Please return to the Emergency Department immediately for any new or worsening symptoms. Please call your primary care provider today to schedule follow-up visit for further treatment of your high blood pressure.  You may need additional blood pressure medications to control your blood pressure and these should be prescribed by your primary care provider.  Please have your primary care provider also recheck your kidney function at your next visit.  Go to the nearest Emergency Department immediately if: You have fever or chills Get a headache. Start to feel mixed up (confused). Feel weak or numb. Feel faint. Have very bad pain in your: Chest. Belly (abdomen). Throw up more than once. Have trouble breathing. You have vision changes or dizziness  you have any new/concerning or worsening of symptoms.    Please read the additional information packets attached to your discharge summary.  Do not take your medicine if  develop an itchy rash, swelling in your mouth or lips, or difficulty breathing; call 911 and seek immediate emergency medical attention if this occurs.  You may review your lab tests and imaging results in their entirety on your MyChart account.  Please discuss all results of fully with your primary care provider and other specialist at your follow-up visit.  Note: Portions of this text may have been transcribed using voice recognition software. Every effort was made to ensure accuracy; however, inadvertent computerized transcription errors may still be present.

## 2021-10-06 NOTE — ED Triage Notes (Signed)
Pt sent to the ed by his pcp. Pt states he had an appointment today with his pcp, when he arrived his blood pressure was very high and was told to come to the ER. Pt denies any symptoms at this time. States he has been taking his bp meds as prescribed.

## 2022-11-21 NOTE — Progress Notes (Signed)
[facility] Note  12/03/2022     CHIEF COMPLAINT Patient presents for Retina Evaluation   HISTORY OF PRESENT ILLNESS: Bryan Glenn is a 52 y.o. male who presents to the clinic today for:   HPI     Retina Evaluation   In both eyes.  This started 25 years ago.  Duration of 25 years.  Response to treatment was no improvement.  I, the attending physician,  performed the HPI with the patient and updated documentation appropriately.        Comments   New pt referred by Dr. Lucianne Lei for diabetic ret eval. Pt states VA has been stable, no issues noted. Has been diabetic for 25 years. Recent A1c (taken a few months ago) was 11. Rx specs are 51 year old.       Last edited by Bernarda Caffey, MD on 12/03/2022  4:27 PM.    Pt is here on the referral of Dr. Williemae Natter for concern of DM exam, pt states is A1c was 11 in February bc he had been off medication for about 3 months, he states he doesn't check his blood sugar like he should, but it's normally around 150 when he does check it  Referring physician: [place], [place],  [place] [address]  HISTORICAL INFORMATION:   Selected notes from the [facility] Referred by Dr. Williemae Natter ([place] for DM exam LEE:  Ocular Hx- PMH-    CURRENT MEDICATIONS: No current outpatient medications on file. (Ophthalmic Drugs)   No current facility-administered medications for this visit. (Ophthalmic Drugs)   Current Outpatient Medications (Other)  Medication Sig   amLODipine (NORVASC) 10 MG tablet Take 1 tablet (10 mg total) by mouth daily.   atorvastatin (LIPITOR) 80 MG tablet Take 1 tablet (80 mg total) by mouth daily.   blood glucose meter kit and supplies KIT Dispense based on patient and insurance preference. Use up to four times daily as directed. (FOR ICD-10 E11.65)   Blood Glucose Monitoring Suppl (ACCU-CHEK AVIVA PLUS)  w/Device KIT Use 4 x daily as directed. E11.65   Dulaglutide (TRULICITY) 3 0000000 SOPN Inject 0.5 mLs (3 mg total) as directed once a week.   glimepiride (AMARYL) 4 MG tablet Take 4 mg by mouth daily with breakfast.   glucose blood (ACCU-CHEK AVIVA) test strip Use as instructed 4 x daily. E11.65   ibuprofen (ADVIL,MOTRIN) 200 MG tablet Take 400 mg by mouth every 4 (four) hours as needed for moderate pain.   Insulin Pen Needle (PEN NEEDLES 5/16") 30G X 8 MM MISC 1 each by Does not apply route 2 (two) times daily.   Lancets (ACCU-CHEK SOFT TOUCH) lancets Use as instructed 4 x daily   losartan (COZAAR) 100 MG tablet Take 1 tablet (100 mg total) by mouth daily.   metoprolol tartrate (LOPRESSOR) 25 MG tablet Take 1 tablet (25 mg total) by mouth 2 (two) times daily.   olmesartan (BENICAR) 40 MG tablet Take 40 mg by mouth daily.   No current facility-administered medications for this visit. (Other)   REVIEW OF SYSTEMS: ROS   Positive for: Endocrine, Cardiovascular Negative for: Constitutional, Gastrointestinal, Neurological, Skin, Genitourinary, Musculoskeletal, HENT, Eyes, Respiratory, Psychiatric, Allergic/Imm, Heme/Lymph Last edited by Kingsley Spittle, COT on 12/03/2022  1:19 PM.     ALLERGIES No Known Allergies  PAST MEDICAL HISTORY Past Medical History:  Diagnosis Date   Ankle fracture  left ankle fibula fracture   Chronic kidney disease    donated a kidney   Diabetes mellitus without complication    Hyperlipidemia    Hypertension    Past Surgical History:  Procedure Laterality Date   NEPHRECTOMY     donated a kidey   ORIF ANKLE FRACTURE Left 02/18/2018   Procedure: OPEN REDUCTION INTERNAL FIXATION (ORIF) LEFT FIBULA;  Surgeon: Newt Minion, MD;  Location: [facility];  Service: Orthopedics;  Laterality: Left;   FAMILY HISTORY Family History  Problem Relation Age of Onset   Glaucoma Mother    Kidney disease Mother    Diabetes Father    Kidney disease Brother     Blindness Paternal Grandmother    SOCIAL HISTORY Social History   Tobacco Use   Smoking status: Some Days    Types: Cigarettes   Smokeless tobacco: Never  Vaping Use   Vaping Use: Former  Substance Use Topics   Alcohol use: Yes    Alcohol/week: 0.0 standard drinks of alcohol    Comment: social   Drug use: No         OPHTHALMIC EXAM:  Base Eye Exam     Visual Acuity (Snellen - Linear)       Right Left   Dist cc 20/25 -2 20/25 -2   Dist ph cc 20/25 20/20 -1    Correction: Glasses         Tonometry (Tonopen, 1:28 PM)       Right Left   Pressure 21 21         Pupils       Pupils Dark Light Shape React APD   Right PERRL 3 2 Round Brisk None   Left PERRL 3 2 Round Brisk None         Visual Fields (Counting fingers)       Left Right    Full Full         Extraocular Movement       Right Left    Full, Ortho Full, Ortho         Neuro/Psych     Oriented x3: Yes   Mood/Affect: Normal         Dilation     Both eyes: 1.0% Mydriacyl, 2.5% Phenylephrine @ 1:29 PM           Slit Lamp and Fundus Exam     Slit Lamp Exam       Right Left   Lids/Lashes Dermatochalasis - upper lid Dermatochalasis - upper lid   Conjunctiva/Sclera Melanosis Melanosis   Cornea Clear Clear   Anterior Chamber deep and clear deep and clear   Iris Round and dilated, No NVI Round and dilated, No NVI   Lens 2+ Nuclear sclerosis, 2+ Cortical cataract 2+ Nuclear sclerosis, 2+ Cortical cataract   Anterior Vitreous mild syneresis mild syneresis         Fundus Exam       Right Left   Disc Pink and Sharp Pink and Sharp   C/D Ratio 0.5 0.5   Macula Flat, Good foveal reflex, No heme or edema Flat, Good foveal reflex, No heme or edema   Vessels mild tortuosity mild tortuosity   Periphery Attached Attached           Refraction     Wearing Rx       Sphere Cylinder Axis   Right -7.25 +0.75 114   Left -7.50 +0.50 082  IMAGING AND PROCEDURES   Imaging and Procedures for 12/03/2022  OCT, Retina - OU - Both Eyes       Right Eye Quality was good. Central Foveal Thickness: 242. Progression has no prior data. Findings include normal foveal contour, no IRF, no SRF, vitreomacular adhesion .   Left Eye Quality was good. Central Foveal Thickness: 248. Progression has no prior data. Findings include normal foveal contour, no IRF, no SRF.   Notes *Images captured and stored on drive  Diagnosis / Impression:  NFP, no IRF/SRF OU No DME OU  Clinical management:  See below  Abbreviations: NFP - Normal foveal profile. CME - cystoid macular edema. PED - pigment epithelial detachment. IRF - intraretinal fluid. SRF - subretinal fluid. EZ - ellipsoid zone. ERM - epiretinal membrane. ORA - outer retinal atrophy. ORT - outer retinal tubulation. SRHM - subretinal hyper-reflective material. IRHM - intraretinal hyper-reflective material      Fluorescein Angiography Optos (Transit OD)       Right Eye Progression has no prior data. Early phase findings include normal observations. Mid/Late phase findings include normal observations (No MA or leakage).   Left Eye Progression has no prior data. Early phase findings include normal observations. Mid/Late phase findings include normal observations (No MA or leakage).   Notes **Images stored on drive**  Impression: No MA or leakage OU No diabetic retinopathy OU            ASSESSMENT/PLAN:    ICD-10-CM   1. Diabetes mellitus type 2 without retinopathy  E11.9 OCT, Retina - OU - Both Eyes    2. Essential hypertension  I10     3. Hypertensive retinopathy of both eyes  H35.033 Fluorescein Angiography Optos (Transit OD)    4. Combined forms of age-related cataract of both eyes  H25.813      Diabetes mellitus, type 2 without retinopathy - last A1c 11.4 on 02.20.24 - pt reports ~3 month span of not having diabetic medications during the first of the year - now back on medications -  despite elevated A1c -- no retinopathy on exam or imaging - The incidence, risk factors for progression, natural history and treatment options for diabetic retinopathy were discussed with patient.   - The need for close monitoring of blood glucose, blood pressure, and serum lipids, avoiding cigarette or any type of tobacco, and the need for long term follow up was also discussed with patient. - f/u in 1 year, sooner prn  2,3. Hypertensive retinopathy OU - discussed importance of tight BP control - monitor  4. Mixed Cataract OU - The symptoms of cataract, surgical options, and treatments and risks were discussed with patient. - discussed diagnosis and progression - monitor  Ophthalmic Meds Ordered this visit:  No orders of the defined types were placed in this encounter.    Return in about 1 year (around 12/03/2023) for DM exam, DFE, OCT.  There are no Patient Instructions on file for this visit.   Explained the diagnoses, plan, and follow up with the patient and they expressed understanding.  Patient expressed understanding of the importance of proper follow up care.   This document serves as a record of services personally performed by Gardiner Sleeper, MD, PhD. It was created on their behalf by Renaldo Reel, [place] an ophthalmic technician. The creation of this record is the provider's dictation and/or activities during the visit.    Electronically signed by:  Renaldo Reel, COT  03.21.24 4:27 PM  This document serves as  a record of services personally performed by Gardiner Sleeper, MD, PhD. It was created on their behalf by San Jetty. Owens Shark, OA an ophthalmic technician. The creation of this record is the provider's dictation and/or activities during the visit.    Electronically signed by: San Jetty. Owens Shark, [place] 04.02.2024 4:27 PM  Gardiner Sleeper, M.D., Ph.D. Diseases & Surgery of the Retina and Vitreous [facility]  I have reviewed the above  documentation for accuracy and completeness, and I agree with the above. Gardiner Sleeper, M.D., Ph.D. 12/03/22 4:30 PM  Abbreviations: M myopia (nearsighted); A astigmatism; H hyperopia (farsighted); P presbyopia; Mrx spectacle prescription;  CTL contact lenses; OD right eye; OS left eye; OU both eyes  XT exotropia; ET esotropia; PEK punctate epithelial keratitis; PEE punctate epithelial erosions; DES dry eye syndrome; MGD meibomian gland dysfunction; ATs artificial tears; PFAT's preservative free artificial tears; [place] nuclear sclerotic cataract; PSC posterior subcapsular cataract; ERM epi-retinal membrane; PVD posterior vitreous detachment; RD retinal detachment; DM diabetes mellitus; DR diabetic retinopathy; NPDR non-proliferative diabetic retinopathy; PDR proliferative diabetic retinopathy; CSME clinically significant macular edema; DME diabetic macular edema; dbh dot blot hemorrhages; CWS cotton wool spot; POAG primary open angle glaucoma; C/D cup-to-disc ratio; HVF humphrey visual field; GVF goldmann visual field; OCT optical coherence tomography; IOP intraocular pressure; BRVO Branch retinal vein occlusion; CRVO central retinal vein occlusion; CRAO central retinal artery occlusion; BRAO branch retinal artery occlusion; RT retinal tear; SB scleral buckle; PPV pars plana vitrectomy; VH Vitreous hemorrhage; PRP panretinal laser photocoagulation; IVK intravitreal kenalog; VMT vitreomacular traction; MH Macular hole;  NVD neovascularization of the disc; NVE neovascularization elsewhere; AREDS age related eye disease study; ARMD age related macular degeneration; POAG primary open angle glaucoma; EBMD epithelial/anterior basement membrane dystrophy; ACIOL anterior chamber intraocular lens; IOL intraocular lens; PCIOL posterior chamber intraocular lens; Phaco/IOL phacoemulsification with intraocular lens placement; [place] photorefractive keratectomy; LASIK laser assisted in situ keratomileusis; HTN hypertension; DM  diabetes mellitus; COPD chronic obstructive pulmonary disease

## 2022-12-03 ENCOUNTER — Ambulatory Visit (INDEPENDENT_AMBULATORY_CARE_PROVIDER_SITE_OTHER): Payer: BC Managed Care – PPO | Admitting: Ophthalmology

## 2022-12-03 ENCOUNTER — Encounter (INDEPENDENT_AMBULATORY_CARE_PROVIDER_SITE_OTHER): Payer: Self-pay | Admitting: Ophthalmology

## 2022-12-03 DIAGNOSIS — E119 Type 2 diabetes mellitus without complications: Secondary | ICD-10-CM

## 2022-12-03 DIAGNOSIS — H35033 Hypertensive retinopathy, bilateral: Secondary | ICD-10-CM

## 2022-12-03 DIAGNOSIS — H25813 Combined forms of age-related cataract, bilateral: Secondary | ICD-10-CM | POA: Diagnosis not present

## 2022-12-03 DIAGNOSIS — I10 Essential (primary) hypertension: Secondary | ICD-10-CM | POA: Diagnosis not present

## 2022-12-03 DIAGNOSIS — H3581 Retinal edema: Secondary | ICD-10-CM

## 2023-05-28 ENCOUNTER — Ambulatory Visit: Payer: BC Managed Care – PPO | Admitting: Family Medicine

## 2023-05-28 ENCOUNTER — Other Ambulatory Visit (INDEPENDENT_AMBULATORY_CARE_PROVIDER_SITE_OTHER): Payer: BC Managed Care – PPO

## 2023-05-28 ENCOUNTER — Encounter: Payer: Self-pay | Admitting: Family Medicine

## 2023-05-28 VITALS — BP 120/88 | HR 100 | Temp 98.5°F | Ht 72.0 in | Wt 175.5 lb

## 2023-05-28 DIAGNOSIS — E785 Hyperlipidemia, unspecified: Secondary | ICD-10-CM

## 2023-05-28 DIAGNOSIS — N528 Other male erectile dysfunction: Secondary | ICD-10-CM

## 2023-05-28 DIAGNOSIS — Z7984 Long term (current) use of oral hypoglycemic drugs: Secondary | ICD-10-CM

## 2023-05-28 DIAGNOSIS — E1122 Type 2 diabetes mellitus with diabetic chronic kidney disease: Secondary | ICD-10-CM

## 2023-05-28 DIAGNOSIS — I10 Essential (primary) hypertension: Secondary | ICD-10-CM | POA: Diagnosis not present

## 2023-05-28 LAB — COMPREHENSIVE METABOLIC PANEL
ALT: 17 U/L (ref 0–53)
AST: 13 U/L (ref 0–37)
Albumin: 4.6 g/dL (ref 3.5–5.2)
Alkaline Phosphatase: 86 U/L (ref 39–117)
BUN: 15 mg/dL (ref 6–23)
CO2: 30 mEq/L (ref 19–32)
Calcium: 10.1 mg/dL (ref 8.4–10.5)
Chloride: 97 mEq/L (ref 96–112)
Creatinine, Ser: 1.55 mg/dL — ABNORMAL HIGH (ref 0.40–1.50)
GFR: 51.76 mL/min — ABNORMAL LOW (ref >60.00)
Glucose, Bld: 299 mg/dL — ABNORMAL HIGH (ref 70–99)
Potassium: 3.9 mEq/L (ref 3.5–5.1)
Sodium: 135 mEq/L (ref 135–145)
Total Bilirubin: 1.1 mg/dL (ref 0.2–1.2)
Total Protein: 8 g/dL (ref 6.0–8.3)

## 2023-05-28 LAB — POCT GLYCOSYLATED HEMOGLOBIN (HGB A1C): Hemoglobin A1C: 14.3 % — AB (ref 4.0–5.6)

## 2023-05-28 LAB — MICROALBUMIN / CREATININE URINE RATIO
Creatinine,U: 162.7 mg/dL
Microalb Creat Ratio: 9.2 mg/g (ref 0.0–30.0)
Microalb, Ur: 15 mg/dL — ABNORMAL HIGH (ref 0.0–1.9)

## 2023-05-28 MED ORDER — TRULICITY 1.5 MG/0.5ML ~~LOC~~ SOAJ: 1.5000 mg | SUBCUTANEOUS | 1 refills | Status: DC

## 2023-05-28 MED ORDER — SILDENAFIL CITRATE 100 MG PO TABS
50.0000 mg | ORAL_TABLET | Freq: Every day | ORAL | 2 refills | Status: DC | PRN
Start: 2023-05-28 — End: 2023-08-25

## 2023-05-28 MED ORDER — METFORMIN HCL 500 MG PO TABS: 500.0000 mg | ORAL_TABLET | Freq: Two times a day (BID) | ORAL | 1 refills | Status: DC

## 2023-05-28 NOTE — Progress Notes (Signed)
New Patient Office Visit  Subjective    Patient ID: Bryan Glenn, male    DOB: 02/19/1971  Age: 52 y.o. MRN: [id number]  CC:  Chief Complaint  Patient presents with   Establish Care    HPI Bryan Glenn presents to establish care Patient is here to establish care. Was previously see by [facility]. Patient has a long history of diabetes that is uncontrolled. He used to be on insulin however he reports that it too expensive for him. A1C today is 14.3 off the insulin. He has no symptoms wahtsoever from his diabetes. No numbness or tingling, no blurry vision, no weight loss or gain.    I reviewed the labs from his previous PCP in the care everywhere section of the chart. He is due for kidney testing today.   Pt also has HTN, BP is controlled today, he denies any chest pain, no SOB, no dizziness.  I have reviewed all aspects of the patient's medical history including social, family, and surgical history.  Current Outpatient Medications  Medication Instructions   atorvastatin (LIPITOR) 80 mg, Oral, Daily   blood glucose meter kit and supplies KIT Dispense based on patient and insurance preference. Use up to four times daily as directed. (FOR ICD-10 E11.65)   Blood Glucose Monitoring Suppl (ACCU-CHEK AVIVA PLUS) w/Device KIT Use 4 x daily as directed. E11.65   chlorthalidone (HYGROTON) 25 mg, Oral, Daily   Cholecalciferol (VITAMIN D-3 PO) 5,000 Units, Oral, Daily   glimepiride (AMARYL) 4 mg, Oral, Daily with breakfast   glucose blood (ACCU-CHEK AVIVA) test strip Use as instructed 4 x daily. E11.65   Insulin Pen Needle (PEN NEEDLES 5/16") 30G X 8 MM MISC 1 each, Does not apply, 2 times daily   Lancets (ACCU-CHEK SOFT TOUCH) lancets Use as instructed 4 x daily   metFORMIN (GLUCOPHAGE) 500 mg, Oral, 2 times daily with meals   metoprolol tartrate (LOPRESSOR) 100 mg, Oral, 2 times daily   sildenafil (VIAGRA) 50 mg, Oral, Daily PRN   Trulicity 1.5 mg,  Subcutaneous, Weekly    Past Medical History:  Diagnosis Date   Ankle fracture    left ankle fibula fracture   Chronic kidney disease    donated a kidney   Diabetes mellitus without complication (HCC)    Hyperlipidemia    Hypertension     Past Surgical History:  Procedure Laterality Date   NEPHRECTOMY     donated a kidey   ORIF ANKLE FRACTURE Left 02/18/2018   Procedure: OPEN REDUCTION INTERNAL FIXATION (ORIF) LEFT FIBULA;  Surgeon: Nadara Mustard, MD;  Location: [facility];  Service: Orthopedics;  Laterality: Left;    Family History  Problem Relation Age of Onset   Hypertension Mother    Glaucoma Mother    Kidney disease Mother    Diabetes Father    Kidney disease Brother    Blindness Paternal Grandmother     Social History   Socioeconomic History   Marital status: Divorced    Spouse name: Not on file   Number of children: Not on file   Years of education: Not on file   Highest education level: Not on file  Occupational History   Not on file  Tobacco Use   Smoking status: Some Days    Types: Cigarettes   Smokeless tobacco: Never  Vaping Use   Vaping status: Never Used  Substance and Sexual Activity   Alcohol use: Yes    Alcohol/week: 0.0 standard drinks of  alcohol    Comment: social   Drug use: Not Currently   Sexual activity: Yes  Other Topics Concern   Not on file  Social History Narrative   Not on file   Social Determinants of Health   Financial Resource Strain: Not on file  Food Insecurity: Not on file  Transportation Needs: Not on file  Physical Activity: Not on file  Stress: Not on file  Social Connections: Not on file  Intimate Partner Violence: Not on file    Review of Systems  All other systems reviewed and are negative.       Objective    BP 120/88 (BP Location: Right Arm, Patient Position: Sitting, Cuff Size: Normal)   Pulse 100   Temp 98.5 F (36.9 C) (Oral)   Ht 6' (1.829 m)   Wt 175 lb 8 oz (79.6 kg)   SpO2 98%   BMI 23.80  kg/m   Physical Exam Vitals reviewed.  Constitutional:      Appearance: Normal appearance. He is well-groomed and normal weight.  Neck:     Thyroid: No thyromegaly.  Cardiovascular:     Rate and Rhythm: Normal rate and regular rhythm.     Heart sounds: S1 normal and S2 normal. No murmur heard. Pulmonary:     Effort: Pulmonary effort is normal.     Breath sounds: Normal breath sounds and air entry. No rales.  Musculoskeletal:     Right lower leg: No edema.     Left lower leg: No edema.  Neurological:     General: No focal deficit present.     Mental Status: He is alert and oriented to person, place, and time.     Gait: Gait is intact.  Psychiatric:        Mood and Affect: Mood and affect normal.         Assessment & Plan:  Type 2 diabetes mellitus with chronic kidney disease, without long-term current use of insulin, unspecified CKD stage (HCC) Assessment & Plan: A1C is severely uncontrolled, I advised we increase the trulicity to 1.5 mg weekly and add back metformin 500 mg BID. Counseled patient for 30 minutes today about reducing sugar and starches in his diet and the importance of getting his blood sugars down to prevent further kidney issues.   Orders: -     POCT glycosylated hemoglobin (Hb A1C) -     Comprehensive metabolic panel; Future -     Microalbumin / creatinine urine ratio; Future -     Trulicity; Inject 1.5 mg into the skin once a week.  Dispense: 6 mL; Refill: 1 -     metFORMIN HCl; Take 1 tablet (500 mg total) by mouth 2 (two) times daily with a meal.  Dispense: 180 tablet; Refill: 1  Essential hypertension, benign Assessment & Plan: Chronic, stable and controlled. Continue medications listed below Current hypertension medications:       Sig   chlorthalidone (HYGROTON) 25 MG tablet (Taking) Take 25 mg by mouth daily.   metoprolol tartrate (LOPRESSOR) 100 MG tablet (Taking) Take 100 mg by mouth 2 (two) times daily.   sildenafil (VIAGRA) 100 MG tablet  (Taking) Take 0.5 tablets (50 mg total) by mouth daily as needed for erectile dysfunction.         Hyperlipidemia, unspecified hyperlipidemia type Assessment & Plan: Pt will need to be on a statin medication, will check labs and likely start a statin at the next visit.    Other male erectile dysfunction Assessment &  Plan: Pt reported this symptom at the end of the visit. We discussed that it was likely due to his uncontrolled DM and other medical issues. Will start patient on sildenafil 50 mg as needed for sexual activity.   Orders: -     Sildenafil Citrate; Take 0.5 tablets (50 mg total) by mouth daily as needed for erectile dysfunction.  Dispense: 10 tablet; Refill: 2    Return in about 3 months (around 08/27/2023) for DM.   Karie Georges, MD

## 2023-06-02 DIAGNOSIS — N528 Other male erectile dysfunction: Secondary | ICD-10-CM | POA: Insufficient documentation

## 2023-06-02 NOTE — Assessment & Plan Note (Signed)
Pt will need to be on a statin medication, will check labs and likely start a statin at the next visit.

## 2023-06-02 NOTE — Assessment & Plan Note (Signed)
Chronic, stable and controlled. Continue medications listed below Current hypertension medications:       Sig   chlorthalidone (HYGROTON) 25 MG tablet (Taking) Take 25 mg by mouth daily.   metoprolol tartrate (LOPRESSOR) 100 MG tablet (Taking) Take 100 mg by mouth 2 (two) times daily.   sildenafil (VIAGRA) 100 MG tablet (Taking) Take 0.5 tablets (50 mg total) by mouth daily as needed for erectile dysfunction.

## 2023-06-02 NOTE — Assessment & Plan Note (Signed)
Pt reported this symptom at the end of the visit. We discussed that it was likely due to his uncontrolled DM and other medical issues. Will start patient on sildenafil 50 mg as needed for sexual activity.

## 2023-06-02 NOTE — Assessment & Plan Note (Signed)
A1C is severely uncontrolled, I advised we increase the trulicity to 1.5 mg weekly and add back metformin 500 mg BID. Counseled patient for 30 minutes today about reducing sugar and starches in his diet and the importance of getting his blood sugars down to prevent further kidney issues.

## 2023-08-24 ENCOUNTER — Other Ambulatory Visit: Payer: Self-pay | Admitting: Family Medicine

## 2023-08-24 DIAGNOSIS — N528 Other male erectile dysfunction: Secondary | ICD-10-CM

## 2023-08-28 ENCOUNTER — Ambulatory Visit: Payer: BC Managed Care – PPO | Admitting: Family Medicine

## 2023-09-04 ENCOUNTER — Ambulatory Visit: Payer: BC Managed Care – PPO | Admitting: Family Medicine

## 2023-09-04 VITALS — BP 138/88 | HR 90 | Temp 98.3°F | Ht 72.0 in | Wt 179.8 lb

## 2023-09-04 DIAGNOSIS — Z7985 Long-term (current) use of injectable non-insulin antidiabetic drugs: Secondary | ICD-10-CM

## 2023-09-04 DIAGNOSIS — Z7984 Long term (current) use of oral hypoglycemic drugs: Secondary | ICD-10-CM

## 2023-09-04 DIAGNOSIS — E1122 Type 2 diabetes mellitus with diabetic chronic kidney disease: Secondary | ICD-10-CM

## 2023-09-04 DIAGNOSIS — E785 Hyperlipidemia, unspecified: Secondary | ICD-10-CM | POA: Diagnosis not present

## 2023-09-04 LAB — POCT GLYCOSYLATED HEMOGLOBIN (HGB A1C): Hemoglobin A1C: 13.1 % — AB (ref 4.0–5.6)

## 2023-09-04 MED ORDER — GLIMEPIRIDE 4 MG PO TABS: 4.0000 mg | ORAL_TABLET | Freq: Every day | ORAL | 1 refills | Status: DC

## 2023-09-04 MED ORDER — METFORMIN HCL ER (MOD) 1000 MG PO TB24: 1000.0000 mg | ORAL_TABLET | Freq: Every day | ORAL | 1 refills | Status: DC

## 2023-09-04 MED ORDER — TRULICITY 3 MG/0.5ML ~~LOC~~ SOAJ: 3.0000 mg | SUBCUTANEOUS | 2 refills | Status: DC

## 2023-09-04 MED ORDER — ATORVASTATIN CALCIUM 40 MG PO TABS: 40.0000 mg | ORAL_TABLET | Freq: Every day | ORAL | 1 refills | Status: DC

## 2023-09-04 NOTE — Assessment & Plan Note (Signed)
 Will switch patient to long acting metformin  1000 mg daily and will increase trulicity , add back the glimepiride  4 mg daily. RTC in 3 months for repeat A1C check. Pt is agreeable to restarting his statin for CVD risk reduction. I counseled the patient on this. Script sent. Foot exam performed today and is normal.

## 2023-09-04 NOTE — Progress Notes (Signed)
 Established Patient Office Visit  Subjective   Patient ID: Bryan Glenn, male    DOB: 05-Dec-1970  Age: 53 y.o. MRN: [id number]  Chief Complaint  Patient presents with   Medical Management of Chronic Issues    Pt is here for follow up. He reports that the metformin  is causing him to have severe diarrhea. States it happens every time he takes the medication. Reports compliance with the medication, also taking the trulicity  1.5 mg weekly. We discussed his medication, he is not taking the BP meds or the statin, counseled patient on statin use in diabetics for risk factor reduction. He is agreeable to going back on the medication. I reinforced dietary reduction in sugar as well this visit.     Current Outpatient Medications  Medication Instructions   atorvastatin  (LIPITOR) 40 mg, Oral, Daily   blood glucose meter kit and supplies KIT Dispense based on patient and insurance preference. Use up to four times daily as directed. (FOR ICD-10 E11.65)   Blood Glucose Monitoring Suppl (ACCU-CHEK AVIVA PLUS) w/Device KIT Use 4 x daily as directed. E11.65   Cholecalciferol (VITAMIN D -3 PO) 5,000 Units, Daily   glimepiride  (AMARYL ) 4 mg, Oral, Daily with breakfast   glucose blood (ACCU-CHEK AVIVA) test strip Use as instructed 4 x daily. E11.65   Insulin  Pen Needle (PEN NEEDLES 5/16) 30G X 8 MM MISC 1 each, Does not apply, 2 times daily   Lancets (ACCU-CHEK SOFT TOUCH) lancets Use as instructed 4 x daily   metFORMIN  (GLUMETZA ) 1,000 mg, Oral, Daily with breakfast   sildenafil  (VIAGRA ) 100 MG tablet TAKE 0.5 TABLETS BY MOUTH DAILY AS NEEDED FOR ERECTILE DYSFUNCTION.   Trulicity  3 mg, Injection, Weekly    Patient Active Problem List   Diagnosis Date Noted   Other male erectile dysfunction 06/02/2023   Vitamin D  deficiency 03/23/2020   Closed displaced fracture of lateral malleolus of left fibula    Type 2 diabetes mellitus with chronic kidney disease, without long-term current use of  insulin  (HCC) 08/14/2015   Essential hypertension, benign 08/14/2015   Hyperlipidemia 08/14/2015      Review of Systems  All other systems reviewed and are negative.     Objective:     BP 138/88   Pulse 90   Temp 98.3 F (36.8 C) (Oral)   Ht 6' (1.829 m)   Wt 179 lb 12.8 oz (81.6 kg)   SpO2 98%   BMI 24.39 kg/m    Physical Exam Vitals reviewed.  Constitutional:      Appearance: Normal appearance. He is well-groomed and normal weight.  Pulmonary:     Breath sounds: Normal air entry.  Musculoskeletal:     Right lower leg: No edema.     Left lower leg: No edema.  Neurological:     General: No focal deficit present.     Mental Status: He is alert and oriented to person, place, and time.     Gait: Gait is intact.  Psychiatric:        Mood and Affect: Mood and affect normal.        Behavior: Behavior normal.      Diabetic Foot Exam - Simple   Simple Foot Form Diabetic Foot exam was performed with the following findings: Yes 09/04/2023  4:44 PM  Visual Inspection No deformities, no ulcerations, no other skin breakdown bilaterally: Yes Sensation Testing Intact to touch and monofilament testing bilaterally: Yes Pulse Check Posterior Tibialis and Dorsalis pulse intact bilaterally: Yes Comments  Results for orders placed or performed in visit on 09/04/23  POC HgB A1c  Result Value Ref Range   Hemoglobin A1C 13.1 (A) 4.0 - 5.6 %   HbA1c POC (<> result, manual entry)     HbA1c, POC (prediabetic range)     HbA1c, POC (controlled diabetic range)        The 10-year ASCVD risk score (Arnett DK, et al., 2019) is: 18.8%    Assessment & Plan:  Type 2 diabetes mellitus with chronic kidney disease, without long-term current use of insulin , unspecified CKD stage [facility]) Assessment & Plan: Will switch patient to long acting metformin  1000 mg daily and will increase trulicity , add back the glimepiride  4 mg daily. RTC in 3 months for repeat A1C check. Pt is agreeable  to restarting his statin for CVD risk reduction. I counseled the patient on this. Script sent. Foot exam performed today and is normal.   Orders: -     POCT glycosylated hemoglobin (Hb A1C) -     metFORMIN  HCl ER (MOD); Take 1 tablet (1,000 mg total) by mouth daily with breakfast.  Dispense: 90 tablet; Refill: 1 -     Trulicity ; Inject 3 mg as directed once a week.  Dispense: 2 mL; Refill: 2 -     Glimepiride ; Take 1 tablet (4 mg total) by mouth daily with breakfast.  Dispense: 90 tablet; Refill: 1  Hyperlipidemia, unspecified hyperlipidemia type -     Atorvastatin  Calcium ; Take 1 tablet (40 mg total) by mouth daily.  Dispense: 90 tablet; Refill: 1     Return in about 3 months (around 12/03/2023) for DM.    Heron CHRISTELLA Sharper, MD

## 2023-09-08 ENCOUNTER — Telehealth: Payer: Self-pay

## 2023-09-08 DIAGNOSIS — E1122 Type 2 diabetes mellitus with diabetic chronic kidney disease: Secondary | ICD-10-CM

## 2023-09-08 NOTE — Telephone Encounter (Signed)
 Pharmacy Patient Advocate Encounter   Received notification from CoverMyMeds that prior authorization for metFORMIN  HCl ER (MOD) 1000MG  er tablets is required/requested.   Insurance verification completed.   The patient is insured through [facility] .   Per test claim: PA required; PA submitted to above mentioned insurance via CoverMyMeds Key/confirmation #/EOC [id number] Status is pending

## 2023-09-10 NOTE — Telephone Encounter (Signed)
 He had severe diarrhea- it says that metformin ext-rel is on the preferred list-this is what I was trying to order-- is there a different one I need to send in?

## 2023-09-10 NOTE — Telephone Encounter (Signed)
 Pharmacy Patient Advocate Encounter  Received notification from [facility] that Prior Authorization for metFORMIN  HCl ER (MOD) 1000MG  er tablets has been DENIED.  See denial reason below. No denial letter attached in CMM. Will attach denial letter to Media tab once received.   CMM Key#: [id number]

## 2023-09-22 ENCOUNTER — Other Ambulatory Visit (HOSPITAL_COMMUNITY): Payer: Self-pay

## 2023-09-23 MED ORDER — METFORMIN HCL ER 500 MG PO TB24: 1000.0000 mg | ORAL_TABLET | Freq: Two times a day (BID) | ORAL | 5 refills | Status: DC

## 2023-09-23 NOTE — Addendum Note (Signed)
Addended by: Karie Georges on: 09/23/2023 03:41 PM   Modules accepted: Orders

## 2023-09-23 NOTE — Telephone Encounter (Signed)
Thanks! I sent a new script for the one that is covered.

## 2023-09-26 ENCOUNTER — Other Ambulatory Visit (HOSPITAL_COMMUNITY): Payer: Self-pay

## 2023-10-06 ENCOUNTER — Other Ambulatory Visit: Payer: Self-pay | Admitting: Family Medicine

## 2023-10-06 DIAGNOSIS — N528 Other male erectile dysfunction: Secondary | ICD-10-CM

## 2023-11-12 ENCOUNTER — Other Ambulatory Visit: Payer: Self-pay | Admitting: Family Medicine

## 2023-11-12 DIAGNOSIS — N528 Other male erectile dysfunction: Secondary | ICD-10-CM

## 2023-11-20 ENCOUNTER — Other Ambulatory Visit: Payer: Self-pay | Admitting: Family Medicine

## 2023-11-20 DIAGNOSIS — E1122 Type 2 diabetes mellitus with diabetic chronic kidney disease: Secondary | ICD-10-CM

## 2023-11-21 NOTE — Telephone Encounter (Signed)
 I increase this at the last visit, please refuse

## 2023-11-21 NOTE — Telephone Encounter (Signed)
Rx denial sent.

## 2023-12-03 ENCOUNTER — Encounter (INDEPENDENT_AMBULATORY_CARE_PROVIDER_SITE_OTHER): Payer: BC Managed Care – PPO | Admitting: Ophthalmology

## 2023-12-03 ENCOUNTER — Ambulatory Visit: Payer: BC Managed Care – PPO | Admitting: Family Medicine

## 2023-12-03 ENCOUNTER — Encounter (INDEPENDENT_AMBULATORY_CARE_PROVIDER_SITE_OTHER): Payer: Self-pay

## 2023-12-03 DIAGNOSIS — I10 Essential (primary) hypertension: Secondary | ICD-10-CM

## 2023-12-03 DIAGNOSIS — E119 Type 2 diabetes mellitus without complications: Secondary | ICD-10-CM

## 2023-12-03 DIAGNOSIS — H35033 Hypertensive retinopathy, bilateral: Secondary | ICD-10-CM

## 2023-12-03 DIAGNOSIS — H25813 Combined forms of age-related cataract, bilateral: Secondary | ICD-10-CM

## 2023-12-09 ENCOUNTER — Ambulatory Visit: Admitting: Family Medicine

## 2023-12-09 ENCOUNTER — Encounter: Payer: Self-pay | Admitting: Family Medicine

## 2023-12-09 VITALS — BP 150/102 | HR 94 | Temp 98.1°F | Ht 72.0 in | Wt 174.4 lb

## 2023-12-09 DIAGNOSIS — E785 Hyperlipidemia, unspecified: Secondary | ICD-10-CM | POA: Diagnosis not present

## 2023-12-09 DIAGNOSIS — Z7985 Long-term (current) use of injectable non-insulin antidiabetic drugs: Secondary | ICD-10-CM

## 2023-12-09 DIAGNOSIS — I10 Essential (primary) hypertension: Secondary | ICD-10-CM

## 2023-12-09 DIAGNOSIS — E1122 Type 2 diabetes mellitus with diabetic chronic kidney disease: Secondary | ICD-10-CM | POA: Diagnosis not present

## 2023-12-09 DIAGNOSIS — Z7984 Long term (current) use of oral hypoglycemic drugs: Secondary | ICD-10-CM | POA: Diagnosis not present

## 2023-12-09 LAB — COMPREHENSIVE METABOLIC PANEL WITH GFR
ALT: 18 U/L (ref 0–53)
AST: 14 U/L (ref 0–37)
Albumin: 5.1 g/dL (ref 3.5–5.2)
Alkaline Phosphatase: 84 U/L (ref 39–117)
BUN: 12 mg/dL (ref 6–23)
CO2: 28 meq/L (ref 19–32)
Calcium: 9.8 mg/dL (ref 8.4–10.5)
Chloride: 98 meq/L (ref 96–112)
Creatinine, Ser: 1.14 mg/dL (ref 0.40–1.50)
GFR: 74.55 mL/min (ref >60.00)
Glucose, Bld: 182 mg/dL — ABNORMAL HIGH (ref 70–99)
Potassium: 3.8 meq/L (ref 3.5–5.1)
Sodium: 136 meq/L (ref 135–145)
Total Bilirubin: 1.1 mg/dL (ref 0.2–1.2)
Total Protein: 7.7 g/dL (ref 6.0–8.3)

## 2023-12-09 LAB — POCT GLYCOSYLATED HEMOGLOBIN (HGB A1C): Hemoglobin A1C: 10 % — AB (ref 4.0–5.6)

## 2023-12-09 LAB — LIPID PANEL
Cholesterol: 143 mg/dL (ref 0–200)
HDL: 51.1 mg/dL (ref >39.00)
LDL Cholesterol: 68 mg/dL (ref 0–99)
NonHDL: 92.05
Total CHOL/HDL Ratio: 3
Triglycerides: 119 mg/dL (ref 0.0–149.0)
VLDL: 23.8 mg/dL (ref 0.0–40.0)

## 2023-12-09 MED ORDER — OLMESARTAN MEDOXOMIL 20 MG PO TABS: 20.0000 mg | ORAL_TABLET | Freq: Every day | ORAL | 1 refills | Status: DC

## 2023-12-09 MED ORDER — TRULICITY 4.5 MG/0.5ML ~~LOC~~ SOAJ
4.5000 mg | SUBCUTANEOUS | 5 refills | Status: DC
Start: 2023-12-09 — End: 2024-06-14

## 2023-12-09 NOTE — Progress Notes (Unsigned)
 Established Patient Office Visit  Subjective   Patient ID: Bryan Glenn, male    DOB: 06-Oct-1970  Age: 53 y.o. MRN: [id number]  Chief Complaint  Patient presents with  . Medical Management of Chronic Issues    Pt is here for 3 month follow up on diabetes. Reports compliance with his medications, denies any side effects to the trulicity.    Current Outpatient Medications  Medication Instructions  . atorvastatin (LIPITOR) 40 mg, Oral, Daily  . blood glucose meter kit and supplies KIT Dispense based on patient and insurance preference. Use up to four times daily as directed. (FOR ICD-10 E11.65)  . Blood Glucose Monitoring Suppl (ACCU-CHEK AVIVA PLUS) w/Device KIT Use 4 x daily as directed. E11.65  . Cholecalciferol (VITAMIN D-3 PO) 5,000 Units, Daily  . glimepiride (AMARYL) 4 mg, Oral, Daily with breakfast  . glucose blood (ACCU-CHEK AVIVA) test strip Use as instructed 4 x daily. E11.65  . Insulin Pen Needle (PEN NEEDLES 5/16") 30G X 8 MM MISC 1 each, Does not apply, 2 times daily  . Lancets (ACCU-CHEK SOFT TOUCH) lancets Use as instructed 4 x daily  . metFORMIN (GLUCOPHAGE-XR) 1,000 mg, Oral, 2 times daily with meals  . olmesartan (BENICAR) 20 mg, Oral, Daily  . sildenafil (VIAGRA) 100 MG tablet TAKE 0.5 TABLETS BY MOUTH DAILY AS NEEDED FOR ERECTILE DYSFUNCTION.  Marland Kitchen Trulicity 4.5 mg, Injection, Weekly    {History (Optional):23778}  Review of Systems  All other systems reviewed and are negative.     Objective:     BP (!) 150/102   Pulse 94   Temp 98.1 F (36.7 C) (Oral)   Ht 6' (1.829 m)   Wt 174 lb 6.4 oz (79.1 kg)   SpO2 98%   BMI 23.65 kg/m  {Vitals History (Optional):23777}  Physical Exam Vitals reviewed.  Constitutional:      Appearance: Normal appearance. He is well-groomed and normal weight.  Eyes:     Extraocular Movements: Extraocular movements intact.     Conjunctiva/sclera: Conjunctivae normal.  Neck:     Thyroid: No thyromegaly.   Cardiovascular:     Rate and Rhythm: Normal rate and regular rhythm.     Heart sounds: S1 normal and S2 normal. No murmur heard. Pulmonary:     Effort: Pulmonary effort is normal.     Breath sounds: Normal breath sounds and air entry. No rales.  Abdominal:     General: Abdomen is flat. Bowel sounds are normal.  Musculoskeletal:     Right lower leg: No edema.     Left lower leg: No edema.  Neurological:     General: No focal deficit present.     Mental Status: He is alert and oriented to person, place, and time.     Gait: Gait is intact.  Psychiatric:        Mood and Affect: Mood and affect normal.     Results for orders placed or performed in visit on 12/09/23  POC HgB A1c  Result Value Ref Range   Hemoglobin A1C 10.0 (A) 4.0 - 5.6 %   HbA1c POC (<> result, manual entry)     HbA1c, POC (prediabetic range)     HbA1c, POC (controlled diabetic range)      {Labs (Optional):23779}  The ASCVD Risk score (Arnett DK, et al., 2019) failed to calculate for the following reasons:   Cannot find a previous HDL lab   Cannot find a previous total cholesterol lab    Assessment & Plan:  Type 2 diabetes mellitus with chronic kidney disease, without long-term current use of insulin, unspecified CKD stage (HCC) -     POCT glycosylated hemoglobin (Hb A1C) -     Collection capillary blood specimen -     Trulicity; Inject 4.5 mg as directed once a week.  Dispense: 2 mL; Refill: 5  Essential hypertension, benign -     Olmesartan Medoxomil; Take 1 tablet (20 mg total) by mouth daily.  Dispense: 90 tablet; Refill: 1 -     Comprehensive metabolic panel with GFR; Future  Hyperlipidemia, unspecified hyperlipidemia type -     Lipid panel; Future     Return in about 3 months (around 03/09/2024) for DM, HTN.    Karie Georges, MD

## 2023-12-10 ENCOUNTER — Encounter: Payer: Self-pay | Admitting: Family Medicine

## 2023-12-10 NOTE — Assessment & Plan Note (Signed)
 Much improved with the trulicity, will increase to 4.5 mg weekly, continue glimepiride 4 mg daily and metformin 1000 mg BID. RTC every 3 months for A1C checks and adjustment of medication until his A1C is 7 or less

## 2023-12-10 NOTE — Assessment & Plan Note (Signed)
 Chronic, BP is elevated today, we discussed adding olmesartan 20 mg daily and he is agreeable. Risks/benefits reviewed with patient. Rx sent to pharmacy

## 2024-01-05 ENCOUNTER — Other Ambulatory Visit: Payer: Self-pay | Admitting: Family Medicine

## 2024-01-05 DIAGNOSIS — N528 Other male erectile dysfunction: Secondary | ICD-10-CM

## 2024-01-18 IMAGING — DX DG CHEST 1V PORT
1 series · 1 of 1 positions shown · non-contrast
Comparison: None.

CLINICAL DATA: Hypertension, electrocardiographic changes

EXAM:
PORTABLE CHEST 1 VIEW

[chest ap]
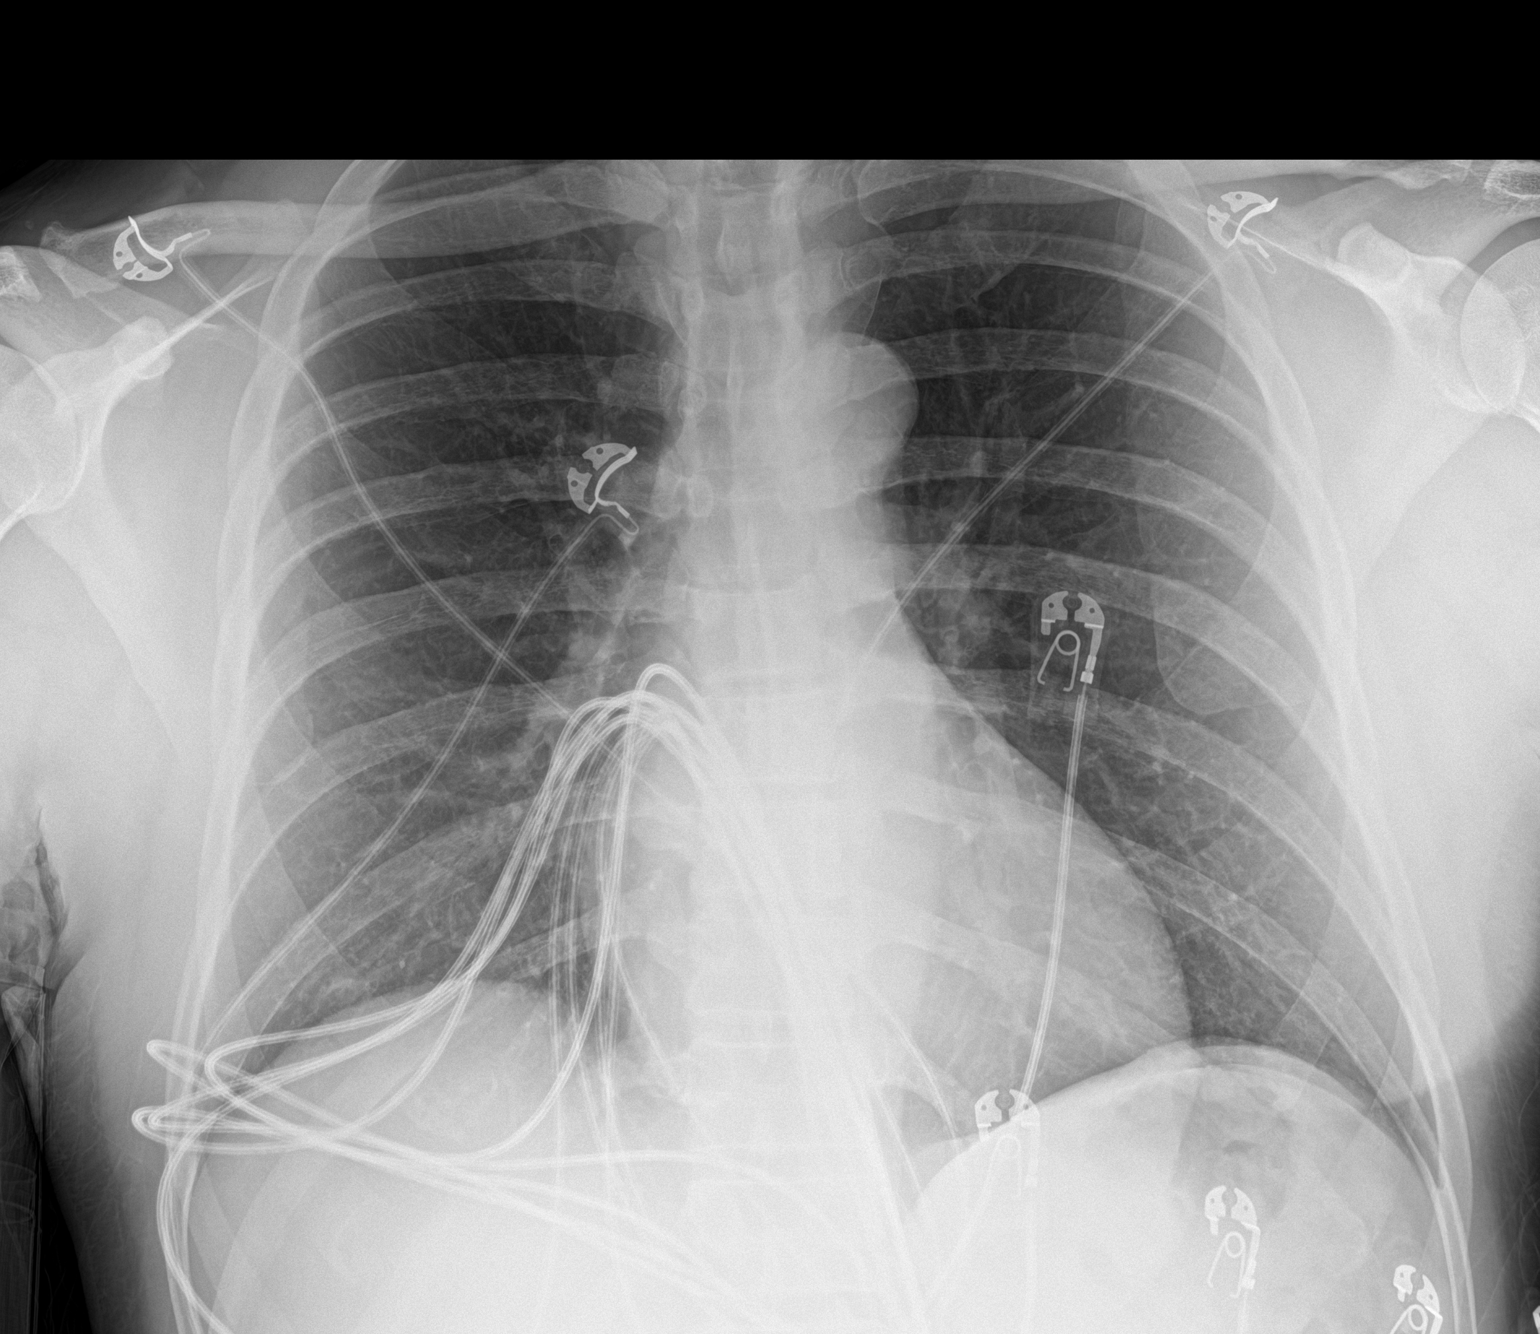

[1 of 1 positions shown; findings below may reference images not displayed]

FINDINGS: The heart size and mediastinal contours are within normal limits.
Both lungs are clear. The visualized skeletal structures are
unremarkable.
IMPRESSION: No active disease.

## 2024-02-25 ENCOUNTER — Other Ambulatory Visit: Payer: Self-pay | Admitting: Family Medicine

## 2024-02-25 DIAGNOSIS — N528 Other male erectile dysfunction: Secondary | ICD-10-CM

## 2024-03-01 ENCOUNTER — Other Ambulatory Visit: Payer: Self-pay | Admitting: Family Medicine

## 2024-03-01 DIAGNOSIS — E785 Hyperlipidemia, unspecified: Secondary | ICD-10-CM

## 2024-03-09 ENCOUNTER — Ambulatory Visit: Admitting: Family Medicine

## 2024-03-09 ENCOUNTER — Encounter: Payer: Self-pay | Admitting: Family Medicine

## 2024-03-09 VITALS — BP 146/100 | HR 102 | Temp 98.6°F | Ht 72.0 in | Wt 170.1 lb

## 2024-03-09 DIAGNOSIS — Z7984 Long term (current) use of oral hypoglycemic drugs: Secondary | ICD-10-CM | POA: Diagnosis not present

## 2024-03-09 DIAGNOSIS — E1122 Type 2 diabetes mellitus with diabetic chronic kidney disease: Secondary | ICD-10-CM

## 2024-03-09 DIAGNOSIS — I10 Essential (primary) hypertension: Secondary | ICD-10-CM | POA: Diagnosis not present

## 2024-03-09 LAB — POCT GLYCOSYLATED HEMOGLOBIN (HGB A1C): Hemoglobin A1C: 10.3 % — AB (ref 4.0–5.6)

## 2024-03-09 MED ORDER — METFORMIN HCL ER 500 MG PO TB24: 1000.0000 mg | ORAL_TABLET | Freq: Two times a day (BID) | ORAL | 5 refills | Status: DC

## 2024-03-09 MED ORDER — EMPAGLIFLOZIN 25 MG PO TABS: 25.0000 mg | ORAL_TABLET | Freq: Every day | ORAL | 1 refills | Status: DC

## 2024-03-09 MED ORDER — GLIMEPIRIDE 4 MG PO TABS: 4.0000 mg | ORAL_TABLET | Freq: Every day | ORAL | 1 refills | Status: DC

## 2024-03-09 MED ORDER — OLMESARTAN MEDOXOMIL 40 MG PO TABS: 40.0000 mg | ORAL_TABLET | Freq: Every day | ORAL | 1 refills | Status: DC

## 2024-03-09 NOTE — Progress Notes (Unsigned)
 Established Patient Office Visit  Subjective   Patient ID: Bryan Glenn, male    DOB: [birth year]  Age: 53 y.o. MRN: [id number]  Chief Complaint  Patient presents with   Medical Management of Chronic Issues    Pt is here for follow up on his DM and HTN  HTN-- BP remains elevated today In the office. He denies any chest pain or SOB, repeat in office was also elevated. We discussed increasing his olmesartan  to 40 mg daily and he is agreeable.  DM-- pt reports compliance with his medication, went up on trulicity  to 4.5 mg weekly however A1C today has not improved. He denies any blurry vision or increased urination. Reports compliance, we discussed adding jardiance  and he is agreeable. RTC in 3 months,.     Current Outpatient Medications  Medication Instructions   atorvastatin  (LIPITOR) 40 mg, Oral, Daily   blood glucose meter kit and supplies KIT Dispense based on patient and insurance preference. Use up to four times daily as directed. (FOR ICD-10 E11.65)   Blood Glucose Monitoring Suppl (ACCU-CHEK AVIVA PLUS) w/Device KIT Use 4 x daily as directed. E11.65   Cholecalciferol (VITAMIN D -3 PO) 5,000 Units, Daily   empagliflozin  (JARDIANCE ) 25 mg, Oral, Daily before breakfast   glimepiride  (AMARYL ) 4 mg, Oral, Daily with breakfast   glucose blood (ACCU-CHEK AVIVA) test strip Use as instructed 4 x daily. E11.65   Insulin  Pen Needle (PEN NEEDLES 5/16) 30G X 8 MM MISC 1 each, Does not apply, 2 times daily   Lancets (ACCU-CHEK SOFT TOUCH) lancets Use as instructed 4 x daily   metFORMIN  (GLUCOPHAGE -XR) 1,000 mg, Oral, 2 times daily with meals   olmesartan  (BENICAR ) 40 mg, Oral, Daily   sildenafil  (VIAGRA ) 100 MG tablet TAKE 0.5 TABLETS BY MOUTH DAILY AS NEEDED FOR ERECTILE DYSFUNCTION.   Trulicity  4.5 mg, Injection, Weekly    Patient Active Problem List   Diagnosis Date Noted   Other male erectile dysfunction 06/02/2023   Vitamin D  deficiency 03/23/2020   Closed displaced  fracture of lateral malleolus of left fibula    Type 2 diabetes mellitus with chronic kidney disease, without long-term current use of insulin  (HCC) 08/14/2015   Essential hypertension, benign 08/14/2015   Hyperlipidemia 08/14/2015      Review of Systems  All other systems reviewed and are negative.     Objective:     BP (!) 146/100   Pulse (!) 102   Temp 98.6 F (37 C) (Oral)   Ht 6' (1.829 m)   Wt 170 lb 1.6 oz (77.2 kg)   SpO2 98%   BMI 23.07 kg/m    Physical Exam Vitals reviewed.  Constitutional:      Appearance: Normal appearance. He is normal weight.  Cardiovascular:     Rate and Rhythm: Normal rate and regular rhythm.     Heart sounds: No murmur heard. Pulmonary:     Effort: Pulmonary effort is normal.     Breath sounds: Normal breath sounds. No wheezing.  Musculoskeletal:     Right lower leg: No edema.     Left lower leg: No edema.  Neurological:     Mental Status: He is alert and oriented to person, place, and time. Mental status is at baseline.      Results for orders placed or performed in visit on 03/09/24  POC HgB A1c  Result Value Ref Range   Hemoglobin A1C 10.3 (A) 4.0 - 5.6 %   HbA1c POC (<> result, manual entry)  HbA1c, POC (prediabetic range)     HbA1c, POC (controlled diabetic range)        The 10-year ASCVD risk score (Arnett DK, et al., 2019) is: 30.7%    Assessment & Plan:  Type 2 diabetes mellitus with chronic kidney disease, without long-term current use of insulin , unspecified CKD stage (HCC) Assessment & Plan: Chronic, uncontrolled. Will add jardiance  25 mg daily to his current regimen and recheck A1C in 3 months. Refilled his metformin , glimepiride  today also.   Orders: -     POCT glycosylated hemoglobin (Hb A1C) -     Collection capillary blood specimen -     Empagliflozin ; Take 1 tablet (25 mg total) by mouth daily before breakfast.  Dispense: 90 tablet; Refill: 1 -     Glimepiride ; Take 1 tablet (4 mg total) by mouth  daily with breakfast.  Dispense: 90 tablet; Refill: 1 -     metFORMIN  HCl ER; Take 2 tablets (1,000 mg total) by mouth 2 (two) times daily with a meal.  Dispense: 120 tablet; Refill: 5  Essential hypertension, benign Assessment & Plan: Chronic, uncontrolled, will increase olmesartan  to 40 mg daily and recheck at his next visit.   Orders: -     Olmesartan  Medoxomil; Take 1 tablet (40 mg total) by mouth daily.  Dispense: 90 tablet; Refill: 1     Return in about 3 months (around 06/09/2024) for DM, annual physical exam.    Heron CHRISTELLA Sharper, MD

## 2024-03-10 NOTE — Assessment & Plan Note (Signed)
 Chronic, uncontrolled, will increase olmesartan  to 40 mg daily and recheck at his next visit.

## 2024-03-10 NOTE — Assessment & Plan Note (Signed)
 Chronic, uncontrolled. Will add jardiance  25 mg daily to his current regimen and recheck A1C in 3 months. Refilled his metformin , glimepiride  today also.

## 2024-06-04 ENCOUNTER — Other Ambulatory Visit: Payer: Self-pay | Admitting: Family Medicine

## 2024-06-04 DIAGNOSIS — N528 Other male erectile dysfunction: Secondary | ICD-10-CM

## 2024-06-14 ENCOUNTER — Ambulatory Visit: Admitting: Family Medicine

## 2024-06-14 ENCOUNTER — Encounter: Payer: Self-pay | Admitting: Family Medicine

## 2024-06-14 VITALS — BP 122/88 | HR 100 | Temp 98.8°F | Ht 72.5 in | Wt 165.7 lb

## 2024-06-14 DIAGNOSIS — E1122 Type 2 diabetes mellitus with diabetic chronic kidney disease: Secondary | ICD-10-CM

## 2024-06-14 DIAGNOSIS — I10 Essential (primary) hypertension: Secondary | ICD-10-CM | POA: Diagnosis not present

## 2024-06-14 DIAGNOSIS — E785 Hyperlipidemia, unspecified: Secondary | ICD-10-CM | POA: Diagnosis not present

## 2024-06-14 DIAGNOSIS — Z7985 Long-term (current) use of injectable non-insulin antidiabetic drugs: Secondary | ICD-10-CM

## 2024-06-14 DIAGNOSIS — Z1211 Encounter for screening for malignant neoplasm of colon: Secondary | ICD-10-CM

## 2024-06-14 DIAGNOSIS — Z0000 Encounter for general adult medical examination without abnormal findings: Secondary | ICD-10-CM

## 2024-06-14 LAB — POCT GLYCOSYLATED HEMOGLOBIN (HGB A1C): Hemoglobin A1C: 11.1 % — AB (ref 4.0–5.6)

## 2024-06-14 LAB — MICROALBUMIN / CREATININE URINE RATIO
Creatinine,U: 248 mg/dL
Microalb Creat Ratio: 93.4 mg/g — ABNORMAL HIGH (ref 0.0–30.0)
Microalb, Ur: 23.2 mg/dL — ABNORMAL HIGH (ref 0.0–1.9)

## 2024-06-14 MED ORDER — ATORVASTATIN CALCIUM 40 MG PO TABS: 40.0000 mg | ORAL_TABLET | Freq: Every day | ORAL | 1 refills | Status: AC

## 2024-06-14 MED ORDER — TIRZEPATIDE 5 MG/0.5ML ~~LOC~~ SOAJ: 5.0000 mg | SUBCUTANEOUS | 3 refills | Status: DC

## 2024-06-14 MED ORDER — METOPROLOL TARTRATE 100 MG PO TABS: 200.0000 mg | ORAL_TABLET | Freq: Two times a day (BID) | ORAL | 1 refills | Status: DC

## 2024-06-14 MED ORDER — GLIMEPIRIDE 4 MG PO TABS: 4.0000 mg | ORAL_TABLET | Freq: Every day | ORAL | 1 refills | Status: AC

## 2024-06-14 MED ORDER — OLMESARTAN MEDOXOMIL 20 MG PO TABS: 20.0000 mg | ORAL_TABLET | Freq: Every day | ORAL | 1 refills | Status: AC

## 2024-06-14 MED ORDER — TRULICITY 4.5 MG/0.5ML ~~LOC~~ SOAJ: 4.5000 mg | SUBCUTANEOUS | 5 refills | Status: DC

## 2024-06-14 NOTE — Progress Notes (Unsigned)
 Complete physical exam  Patient: Bryan Glenn   DOB: 10-31-1970   53 y.o. Male  MRN: [id number]  Subjective:    Chief Complaint  Patient presents with   Annual Exam    Bryan Glenn is a 53 y.o. male who presents today for a complete physical exam. He reports consuming a general diet. Doesn't really watching his diet, just eating whatever, is getting good sources of protein. Doesn't drink soda. The patient does not participate in regular exercise at present. He generally feels well. He reports sleeping well. He does not have additional problems to discuss today.    Most recent fall risk assessment:    12/21/2019    2:01 PM  Fall Risk   Falls in the past year? 0   Number falls in past yr: 0  Injury with Fall? 0     Data saved with a previous flowsheet row definition     Most recent depression screenings:    05/28/2023   10:25 AM 03/22/2020    9:53 AM  PHQ 2/9 Scores  PHQ - 2 Score 4 0  PHQ- 9 Score 7 0    Vision:Within last year and Dental: No current dental problems and Receives regular dental care  Patient Active Problem List   Diagnosis Date Noted   Other male erectile dysfunction 06/02/2023   Vitamin D  deficiency 03/23/2020   Closed displaced fracture of lateral malleolus of left fibula    Type 2 diabetes mellitus with chronic kidney disease, without long-term current use of insulin  (HCC) 08/14/2015   Essential hypertension, benign 08/14/2015   Hyperlipidemia 08/14/2015      Patient Care Team: Ozell Heron HERO, MD as PCP - General (Family Medicine)   Outpatient Medications Prior to Visit  Medication Sig   blood glucose meter kit and supplies KIT Dispense based on patient and insurance preference. Use up to four times daily as directed. (FOR ICD-10 E11.65)   Blood Glucose Monitoring Suppl (ACCU-CHEK AVIVA PLUS) w/Device KIT Use 4 x daily as directed. E11.65   Cholecalciferol (VITAMIN D -3 PO) Take 5,000 Units by mouth daily.   glucose  blood (ACCU-CHEK AVIVA) test strip Use as instructed 4 x daily. E11.65   Insulin  Pen Needle (PEN NEEDLES 5/16) 30G X 8 MM MISC 1 each by Does not apply route 2 (two) times daily.   Lancets (ACCU-CHEK SOFT TOUCH) lancets Use as instructed 4 x daily   metFORMIN  (GLUCOPHAGE -XR) 500 MG 24 hr tablet Take 2 tablets (1,000 mg total) by mouth 2 (two) times daily with a meal.   sildenafil  (VIAGRA ) 100 MG tablet TAKE 0.5 TABLETS BY MOUTH DAILY AS NEEDED FOR ERECTILE DYSFUNCTION.   [DISCONTINUED] atorvastatin  (LIPITOR) 40 MG tablet TAKE 1 TABLET BY MOUTH EVERY DAY   [DISCONTINUED] Dulaglutide  (TRULICITY ) 4.5 MG/0.5ML SOAJ Inject 4.5 mg as directed once a week.   [DISCONTINUED] glimepiride  (AMARYL ) 4 MG tablet Take 1 tablet (4 mg total) by mouth daily with breakfast.   [DISCONTINUED] metoprolol  tartrate (LOPRESSOR ) 100 MG tablet Take 200 mg by mouth 2 (two) times daily.   [DISCONTINUED] olmesartan  (BENICAR ) 20 MG tablet Take 20 mg by mouth daily.   [DISCONTINUED] empagliflozin  (JARDIANCE ) 25 MG TABS tablet Take 1 tablet (25 mg total) by mouth daily before breakfast.   [DISCONTINUED] olmesartan  (BENICAR ) 40 MG tablet Take 1 tablet (40 mg total) by mouth daily.   No facility-administered medications prior to visit.    Review of Systems  HENT:  Negative for hearing loss.   Eyes:  Negative for blurred vision.  Respiratory:  Negative for shortness of breath.   Cardiovascular:  Negative for chest pain.  Gastrointestinal: Negative.   Genitourinary: Negative.   Musculoskeletal:  Negative for back pain.  Neurological:  Negative for headaches.  Psychiatric/Behavioral:  Negative for depression.        Objective:     BP 122/88   Pulse 100   Temp 98.8 F (37.1 C) (Oral)   Ht 6' 0.5 (1.842 m)   Wt 165 lb 11.2 oz (75.2 kg)   SpO2 98%   BMI 22.16 kg/m  {Vitals History (Optional):23777}  Physical Exam Vitals reviewed.  Constitutional:      Appearance: Normal appearance. He is well-groomed and  normal weight.  HENT:     Right Ear: Tympanic membrane and ear canal normal.     Left Ear: Tympanic membrane and ear canal normal.     Mouth/Throat:     Mouth: Mucous membranes are moist.     Pharynx: No posterior oropharyngeal erythema.  Eyes:     Extraocular Movements: Extraocular movements intact.     Conjunctiva/sclera: Conjunctivae normal.  Neck:     Thyroid: No thyromegaly.  Cardiovascular:     Rate and Rhythm: Normal rate and regular rhythm.     Heart sounds: S1 normal and S2 normal. No murmur heard. Pulmonary:     Effort: Pulmonary effort is normal.     Breath sounds: Normal breath sounds and air entry. No rales.  Abdominal:     General: Abdomen is flat. Bowel sounds are normal.  Musculoskeletal:     Right lower leg: No edema.     Left lower leg: No edema.  Lymphadenopathy:     Cervical: No cervical adenopathy.  Neurological:     General: No focal deficit present.     Mental Status: He is alert and oriented to person, place, and time.     Gait: Gait is intact.  Psychiatric:        Mood and Affect: Mood and affect normal.      Results for orders placed or performed in visit on 06/14/24  POC HgB A1c  Result Value Ref Range   Hemoglobin A1C 11.1 (A) 4.0 - 5.6 %   HbA1c POC (<> result, manual entry)     HbA1c, POC (prediabetic range)     HbA1c, POC (controlled diabetic range)     {Show previous labs (optional):23779}    Assessment & Plan:    Routine Health Maintenance and Physical Exam  Immunization History  Administered Date(s) Administered   Influenza,inj,quad, With Preservative 07/03/2018   Moderna Sars-Covid-2 Vaccination 10/25/2019, 11/23/2019   Pneumococcal Polysaccharide-23 03/22/2020   Tdap 03/22/2020    Health Maintenance  Topic Date Due   Diabetic kidney evaluation - Urine ACR  Never done   Hepatitis B Vaccines 19-59 Average Risk (1 of 3 - 19+ 3-dose series) Never done   Pneumococcal Vaccine: 50+ Years (2 of 2 - PCV) 03/22/2021   Zoster  Vaccines- Shingrix (1 of 2) Never done   OPHTHALMOLOGY EXAM  12/03/2023   COVID-19 Vaccine (3 - 2025-26 season) 05/03/2024   Fecal DNA (Cologuard)  05/27/2024   HIV Screening  09/03/2024 (Originally 08/07/1987)   Influenza Vaccine  11/30/2024 (Originally 04/02/2024)   Hepatitis C Screening  06/14/2025 (Originally 08/06/1990)   FOOT EXAM  09/03/2024   Diabetic kidney evaluation - eGFR measurement  12/08/2024   HEMOGLOBIN A1C  12/13/2024   DTaP/Tdap/Td (2 - Td or Tdap) 03/22/2030   HPV VACCINES  Aged Out   Meningococcal B Vaccine  Aged Out    Discussed health benefits of physical activity, and encouraged him to engage in regular exercise appropriate for his age and condition.  Routine adult health maintenance  Type 2 diabetes mellitus with chronic kidney disease, without long-term current use of insulin , unspecified CKD stage (HCC) -     POCT glycosylated hemoglobin (Hb A1C) -     Collection capillary blood specimen -     Glimepiride ; Take 1 tablet (4 mg total) by mouth daily with breakfast.  Dispense: 90 tablet; Refill: 1 -     AMB Referral VBCI Care Management -     Microalbumin / creatinine urine ratio; Future -     Tirzepatide; Inject 5 mg into the skin once a week.  Dispense: 2 mL; Refill: 3  Essential hypertension, benign -     Metoprolol  Tartrate; Take 2 tablets (200 mg total) by mouth 2 (two) times daily.  Dispense: 180 tablet; Refill: 1 -     Olmesartan  Medoxomil; Take 1 tablet (20 mg total) by mouth daily.  Dispense: 90 tablet; Refill: 1  Hyperlipidemia, unspecified hyperlipidemia type -     Atorvastatin  Calcium ; Take 1 tablet (40 mg total) by mouth daily.  Dispense: 90 tablet; Refill: 1  Colon cancer screening -     Cologuard    Return in about 3 months (around 09/14/2024) for DM.     Heron CHRISTELLA Sharper, MD

## 2024-06-14 NOTE — Patient Instructions (Addendum)
 CONTINUE the metoprolol  200 mg twice daily  CONTINUE the olmesartan  at 20 mg daily  SWITCHING from Trulicity  to MOUNJARO -- starting at 5 mg   The pharmacist Stoney) is going to call you  to get you set up with the patient assistance program  for [place]  Ask insurance if they cover the shingles vaccines.

## 2024-06-16 ENCOUNTER — Telehealth: Payer: Self-pay | Admitting: *Deleted

## 2024-06-16 NOTE — Assessment & Plan Note (Signed)
 A1C worse than previous, patient has essentially failed trulicity , will switch him to Mounjaro and continue to increase the dosage. Pt continues to report diarrhea despite using long acting metformin , will continue this and the glimepiride . Will continue to see him every 3 months until his A1C is improved. Labs ordered for today.

## 2024-06-16 NOTE — Progress Notes (Signed)
 Care Guide Pharmacy Note  06/16/2024 Name: Bryan Glenn MRN: [id number] DOB: November 11, 1970  Referred By: Ozell Heron HERO, MD Reason for referral: Call Attempt #1 and Complex Care Management (Outreach to schedule referral with pharmacist )   Bryan Glenn is a 53 y.o. year old male who is a primary care patient of Ozell Heron HERO, MD.  Bryan Glenn was referred to the pharmacist for assistance related to: DMII  Successful contact was made with the patient to discuss pharmacy services including being ready for the pharmacist to call at least 5 minutes before the scheduled appointment time and to have medication bottles and any blood pressure readings ready for review. The patient agreed to meet with the pharmacist via telephone visit on 06/30/2024  Bryan Glenn, CMA [place]  [facility], [facility] Guide Direct Dial: [phone]  Fax: [phone] Website: [email or web address]

## 2024-06-16 NOTE — Progress Notes (Signed)
 Care Guide Pharmacy Note  06/16/2024 Name: Bryan Glenn MRN: [id number] DOB: 1971/02/08  Referred By: Ozell Heron HERO, MD Reason for referral: Call Attempt #1 and Complex Care Management (Outreach to schedule referral with pharmacist )   [name] is a 53 y.o. year old male who is a primary care patient of Ozell Heron HERO, MD.  [name] was referred to the pharmacist for assistance related to: DMII  An unsuccessful telephone outreach was attempted today to contact the patient who was referred to the pharmacy team for assistance with medication management. Additional attempts will be made to contact the patient.  [name], CMA [place]  [facility], [facility] Guide Direct Dial: [phone]  Fax: [phone] Website: [email or web address]

## 2024-06-21 ENCOUNTER — Ambulatory Visit: Payer: Self-pay | Admitting: Family Medicine

## 2024-06-21 DIAGNOSIS — E1122 Type 2 diabetes mellitus with diabetic chronic kidney disease: Secondary | ICD-10-CM

## 2024-06-30 ENCOUNTER — Other Ambulatory Visit: Payer: Self-pay

## 2024-06-30 ENCOUNTER — Other Ambulatory Visit

## 2024-06-30 DIAGNOSIS — E1122 Type 2 diabetes mellitus with diabetic chronic kidney disease: Secondary | ICD-10-CM

## 2024-06-30 MED ORDER — EMPAGLIFLOZIN 25 MG PO TABS
25.0000 mg | ORAL_TABLET | Freq: Every day | ORAL | 1 refills | Status: AC
  Filled 2024-06-30: qty 30, 30d supply, fill #0
  Filled 2024-07-27 – 2024-08-10 (×2): qty 30, 30d supply, fill #1
  Filled 2024-09-29: qty 30, 30d supply, fill #2

## 2024-06-30 MED ORDER — TIRZEPATIDE 5 MG/0.5ML ~~LOC~~ SOAJ
5.0000 mg | SUBCUTANEOUS | 3 refills | Status: DC
  Filled 2024-06-30: qty 2, 28d supply, fill #0

## 2024-06-30 NOTE — Progress Notes (Signed)
 06/30/2024 Name: Bryan Glenn MRN: [id number] DOB: 31-Jan-1971  Chief Complaint  Patient presents with   Medication Management   Diabetes    Bryan Glenn is a 53 y.o. year old male who presented for a telephone visit.   They were referred to the pharmacist by their PCP for assistance in managing diabetes and medication access.    Subjective:  Care Team: Primary Care Provider: Ozell Heron HERO, MD ;   Medication Access/Adherence  Current Pharmacy:  [facility] [phone] [address], [place] - [address] AT [facility] [address] [place] [address] Phone: [phone] Fax: [phone]  [facility] - [facility] [address], [address] [place] [address] Phone: [phone] Fax: [phone]   Patient reports affordability concerns with their medications: Yes  - Mounjaro and Jardiance  Patient reports access/transportation concerns to their pharmacy: No  Patient reports adherence concerns with their medications:  No     Diabetes:  Current medications: Mounjaro (has not started due to cost), Jardiance  (has not started due to cost), Metformin  1000mg  BID, Glimepiride  Medications tried in the past: Trulicity  (gave last injection on 06/26/24)  Current glucose readings: Not checking  Observed patterns:  Patient denies hypoglycemic s/sx including dizziness, shakiness, sweating. Patient denies hyperglycemic symptoms including polyuria, polydipsia, polyphagia, nocturia, neuropathy, blurred vision.    Current physical activity: None  Current medication access support: None  Macrovascular and Microvascular Risk Reduction:  Statin? yes (atorvastatin  - just restarted); ACEi/ARB? yes (olmesartan ) Last urinary albumin/creatinine ratio:  Lab Results  Component Value Date   MICRALBCREAT 93.4 (H) 06/14/2024   Last eye exam:   Last foot exam: 09/04/2023 Tobacco Use:   Tobacco Use: High Risk (06/14/2024)   Patient History    Smoking Tobacco Use: Some Days    Smokeless Tobacco Use: Never    Passive Exposure: Not on file     Objective:  Lab Results  Component Value Date   HGBA1C 11.1 (A) 06/14/2024    Lab Results  Component Value Date   CREATININE 1.14 12/09/2023   BUN 12 12/09/2023   NA 136 12/09/2023   K 3.8 12/09/2023   CL 98 12/09/2023   CO2 28 12/09/2023    Lab Results  Component Value Date   CHOL 143 12/09/2023   HDL 51.10 12/09/2023   LDLCALC 68 12/09/2023   TRIG 119.0 12/09/2023   CHOLHDL 3 12/09/2023    Medications Reviewed Today     Reviewed by Lionell Jon DEL, RPH (Pharmacist) on 06/30/24 at 1147  Med List Status: <None>   Medication Order Taking? Sig Documenting Provider Last Dose Status Informant  atorvastatin  (LIPITOR) 40 MG tablet [id number] Yes Take 1 tablet (40 mg total) by mouth daily. Ozell Heron HERO, MD  Active   blood glucose meter kit and supplies KIT [id number]  Dispense based on patient and insurance preference. Use up to four times daily as directed. (FOR ICD-10 E11.65) Nida, Gebreselassie W, MD  Active Self  Blood Glucose Monitoring Suppl (ACCU-CHEK AVIVA PLUS) w/Device KIT [id number]  Use 4 x daily as directed. E11.65 Nida, Gebreselassie W, MD  Active Self  Cholecalciferol (VITAMIN D -3 PO) [id number] Yes Take 5,000 Units by mouth daily. [provider]  Active   empagliflozin  (JARDIANCE ) 25 MG TABS tablet [id number] Yes Take 1 tablet (25 mg total) by mouth daily. Ozell Heron HERO, MD  Active   glimepiride  (AMARYL ) 4 MG tablet [id number] Yes Take 1 tablet (4 mg total) by  mouth daily with breakfast. Ozell Heron HERO, MD  Active   glucose blood (ACCU-CHEK AVIVA) test strip [id number]  Use as instructed 4 x daily. E11.65 Nida, Gebreselassie W, MD  Active Self  Insulin  Pen Needle (PEN NEEDLES 5/16) 30G X 8 MM MISC [id number]  1 each by Does not apply route 2 (two) times daily. Nida, Gebreselassie W, MD   Active Self  Lancets (ACCU-CHEK SOFT [facility]) lancets [id number]  Use as instructed 4 x daily Nida, Gebreselassie W, MD  Active Self  metFORMIN  (GLUCOPHAGE -XR) 500 MG 24 hr tablet [id number] Yes Take 2 tablets (1,000 mg total) by mouth 2 (two) times daily with a meal. Ozell Heron HERO, MD  Active   metoprolol  tartrate (LOPRESSOR ) 100 MG tablet [id number] Yes Take 2 tablets (200 mg total) by mouth 2 (two) times daily. Ozell Heron HERO, MD  Active   olmesartan  (BENICAR ) 20 MG tablet [id number] Yes Take 1 tablet (20 mg total) by mouth daily. Ozell Heron HERO, MD  Active   sildenafil  (VIAGRA ) 100 MG tablet [id number]  TAKE 0.5 TABLETS BY MOUTH DAILY AS NEEDED FOR ERECTILE DYSFUNCTION. Ozell Heron HERO, MD  Active   tirzepatide [facility]) 5 MG/0.5ML Pen [id number] Yes Inject 5 mg into the skin once a week. Ozell Heron HERO, MD  Active               Assessment/Plan:   Diabetes: - Currently uncontrolled; goal A1c <7%. Cardiorenal risk reduction is optimized.. Blood pressure is not at goal <130/80. LDL is not at goal.  - Reviewed long term cardiovascular and renal outcomes of uncontrolled blood sugar. and Reviewed goal A1c, goal fasting, and goal 2 hour post prandial glucose. Recommended to check glucose daily - Recommend to start Mounjaro 5mg  and Jardiance  25mg  as previously prescribed by PCP now that they are affordable.Counseled that Mounjaro replaces Trulicity  and to not inject Mounjaro until 7 days after last trulicity  dose (this will be 11/1) . -Counseled on importance of med adherence. Reach out sooner than scheduled f/u if lows occur.  Follow Up Plan: 1 month  [name], PharmD Clinical Pharmacist [phone]

## 2024-07-02 ENCOUNTER — Other Ambulatory Visit: Payer: Self-pay

## 2024-07-21 ENCOUNTER — Other Ambulatory Visit: Payer: Self-pay

## 2024-07-21 MED ORDER — TIRZEPATIDE 7.5 MG/0.5ML ~~LOC~~ SOAJ
7.5000 mg | SUBCUTANEOUS | 5 refills | Status: AC
  Filled 2024-07-21: qty 2, 28d supply, fill #0
  Filled 2024-07-27 – 2024-08-20 (×2): qty 2, 28d supply, fill #1
  Filled 2024-09-29: qty 2, 28d supply, fill #2

## 2024-07-26 ENCOUNTER — Telehealth: Payer: Self-pay

## 2024-07-26 ENCOUNTER — Other Ambulatory Visit

## 2024-07-26 NOTE — Progress Notes (Signed)
   07/26/2024  Patient ID: Bryan Glenn, male   DOB: Feb 02, 1971, 53 y.o.   MRN: [id number]  Attempted to contact patient for scheduled appointment for medication management. No voicemail set up, unable to leave message.  [name], PharmD Clinical Pharmacist [phone]

## 2024-07-27 ENCOUNTER — Other Ambulatory Visit: Payer: Self-pay | Admitting: Family Medicine

## 2024-07-27 ENCOUNTER — Other Ambulatory Visit: Payer: Self-pay

## 2024-08-05 ENCOUNTER — Other Ambulatory Visit: Payer: Self-pay

## 2024-08-10 ENCOUNTER — Other Ambulatory Visit: Payer: Self-pay

## 2024-08-30 ENCOUNTER — Other Ambulatory Visit: Payer: Self-pay

## 2024-09-13 ENCOUNTER — Other Ambulatory Visit: Payer: Self-pay | Admitting: Family Medicine

## 2024-09-13 DIAGNOSIS — I10 Essential (primary) hypertension: Secondary | ICD-10-CM

## 2024-09-30 ENCOUNTER — Other Ambulatory Visit (HOSPITAL_COMMUNITY): Payer: Self-pay

## 2024-09-30 ENCOUNTER — Other Ambulatory Visit: Payer: Self-pay

## 2024-10-08 ENCOUNTER — Other Ambulatory Visit: Payer: Self-pay
